# Patient Record
Sex: Male | Born: 1965
Health system: Southern US, Community
[De-identification: ages and names within clinical notes are randomized; demographics above are authoritative.]

## PROBLEM LIST (undated history)

## (undated) DIAGNOSIS — I10 Essential (primary) hypertension: Secondary | ICD-10-CM

## (undated) DIAGNOSIS — K219 Gastro-esophageal reflux disease without esophagitis: Secondary | ICD-10-CM

## (undated) DIAGNOSIS — R7303 Prediabetes: Secondary | ICD-10-CM

## (undated) DIAGNOSIS — E7800 Pure hypercholesterolemia, unspecified: Secondary | ICD-10-CM

## (undated) HISTORY — DX: Pure hypercholesterolemia, unspecified: E78.00

## (undated) HISTORY — DX: Prediabetes: R73.03

---

## 1998-04-04 ENCOUNTER — Emergency Department (HOSPITAL_COMMUNITY): Admission: EM | Admit: 1998-04-04 | Discharge: 1998-04-05 | Payer: Self-pay | Admitting: Emergency Medicine

## 2000-11-17 ENCOUNTER — Encounter: Payer: Self-pay | Admitting: Internal Medicine

## 2000-11-17 ENCOUNTER — Observation Stay (HOSPITAL_COMMUNITY): Admission: EM | Admit: 2000-11-17 | Discharge: 2000-11-18 | Payer: Self-pay | Admitting: Emergency Medicine

## 2003-03-02 ENCOUNTER — Emergency Department (HOSPITAL_COMMUNITY): Admission: AD | Admit: 2003-03-02 | Discharge: 2003-03-02 | Payer: Self-pay | Admitting: Family Medicine

## 2004-04-25 ENCOUNTER — Emergency Department (HOSPITAL_COMMUNITY): Admission: EM | Admit: 2004-04-25 | Discharge: 2004-04-25 | Payer: Self-pay | Admitting: Emergency Medicine

## 2005-10-01 ENCOUNTER — Emergency Department (HOSPITAL_COMMUNITY): Admission: EM | Admit: 2005-10-01 | Discharge: 2005-10-01 | Payer: Self-pay | Admitting: Emergency Medicine

## 2005-10-27 ENCOUNTER — Emergency Department (HOSPITAL_COMMUNITY): Admission: EM | Admit: 2005-10-27 | Discharge: 2005-10-27 | Payer: Self-pay | Admitting: Family Medicine

## 2006-05-31 IMAGING — CR DG TOE GREAT 2+V*R*
3 series · 3 of 3 positions shown · non-contrast
Comparison: none

CLINICAL DATA: Dropped desk on toe.
 RIGHT GREAT TOE:
 There is a fracture of the tuft of the distal first phalanx with slight displacement.  There is some degenerative spurring on the medial aspect of the distal first phalanx.

[view not recorded (1 of 3)]
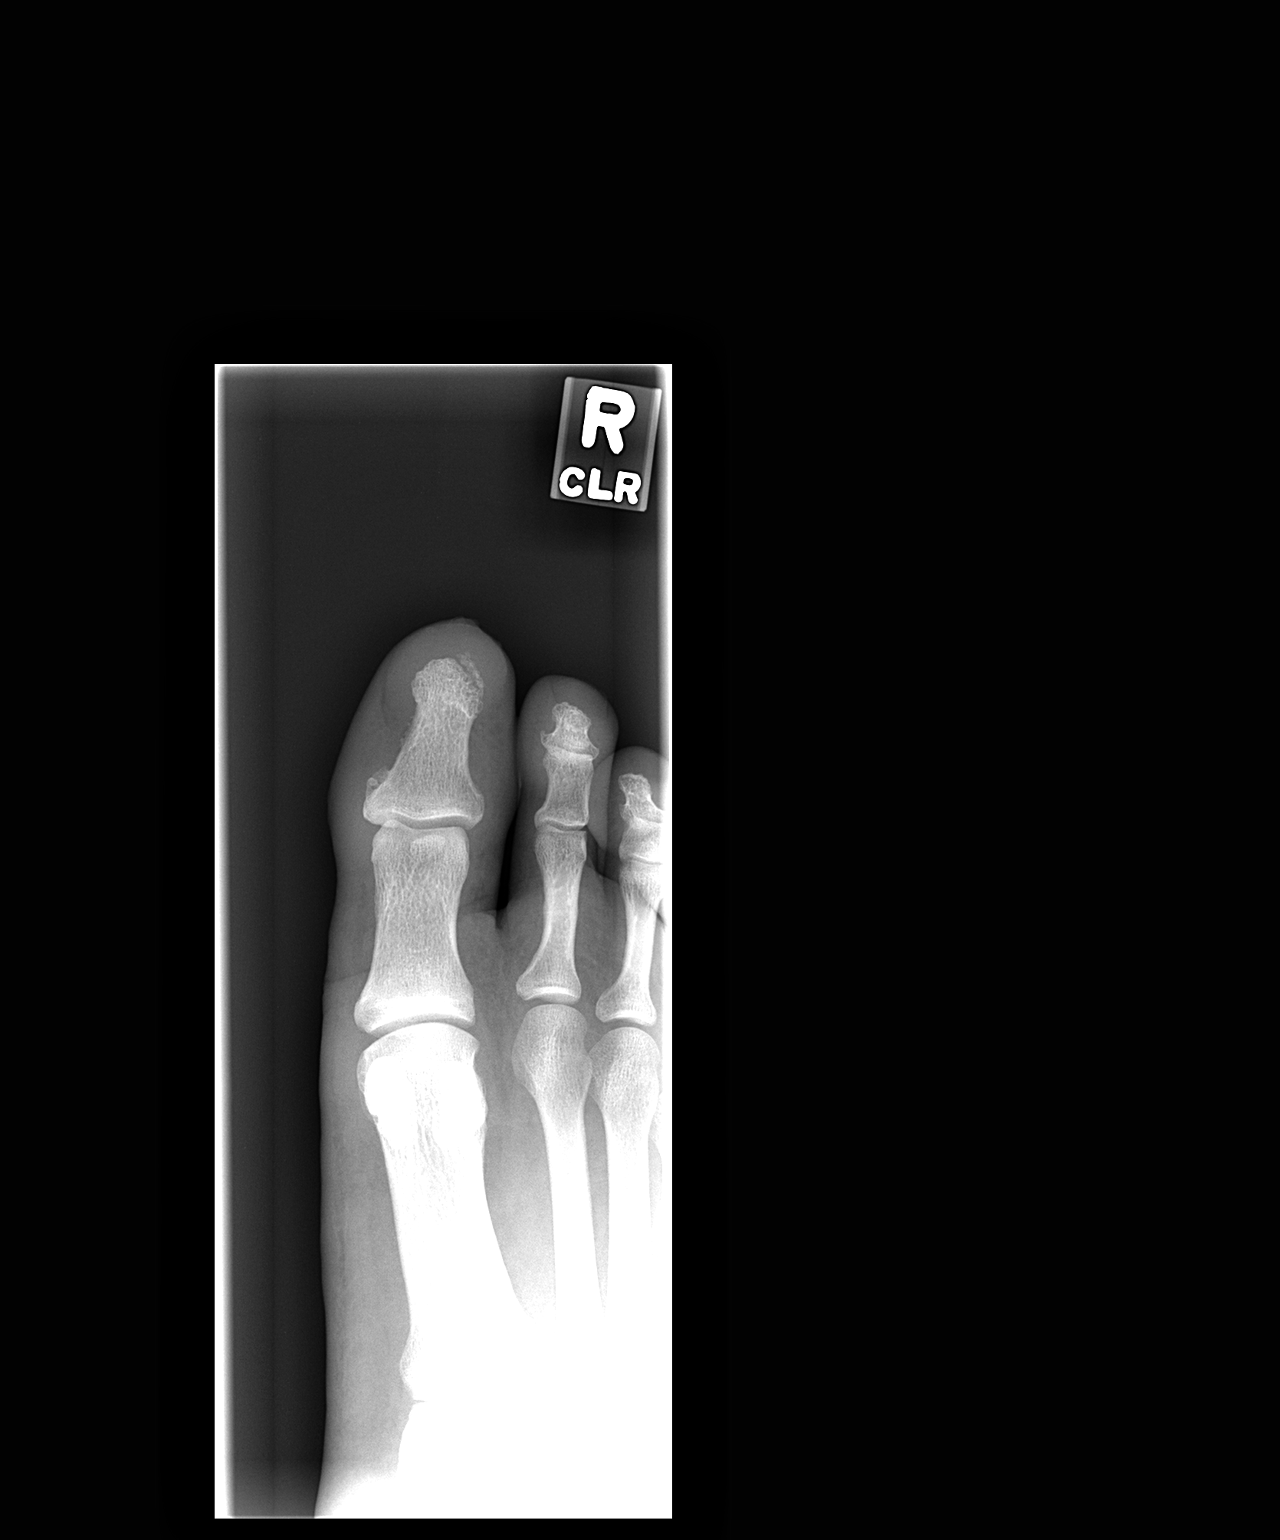

[view not recorded (2 of 3)]
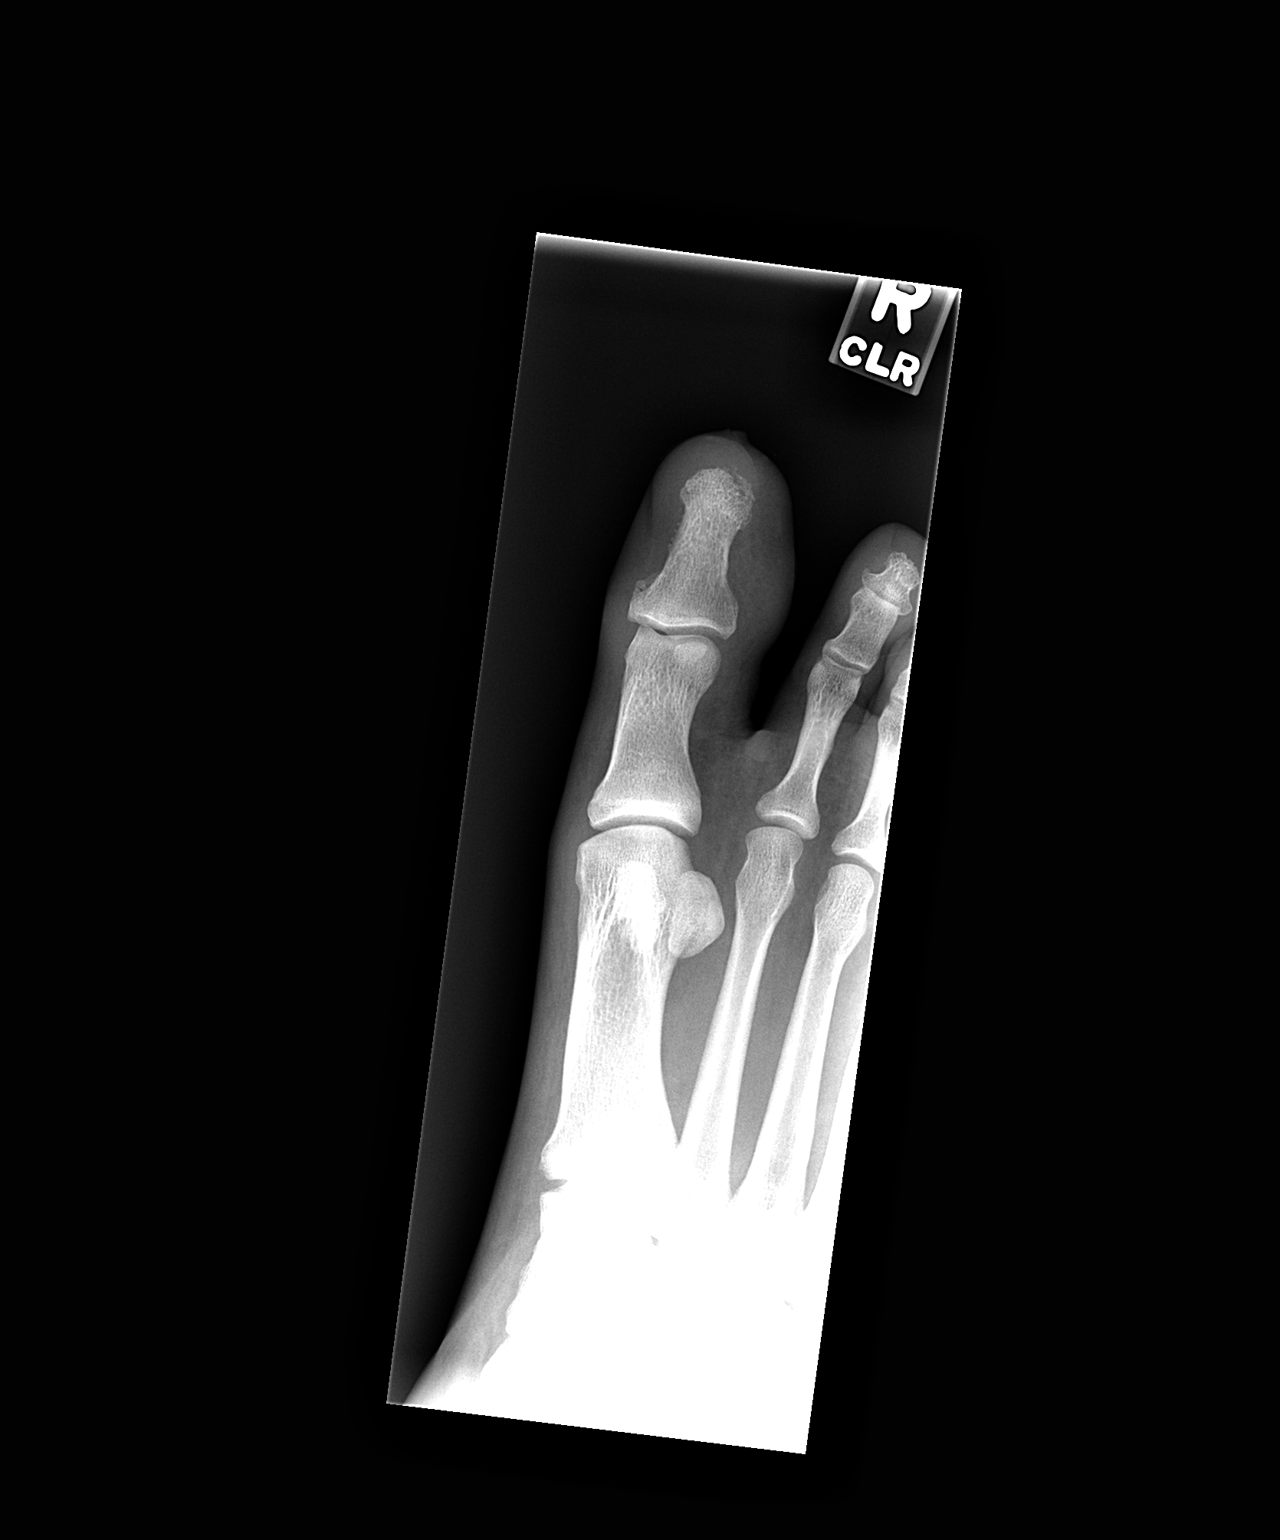

[view not recorded (3 of 3)]
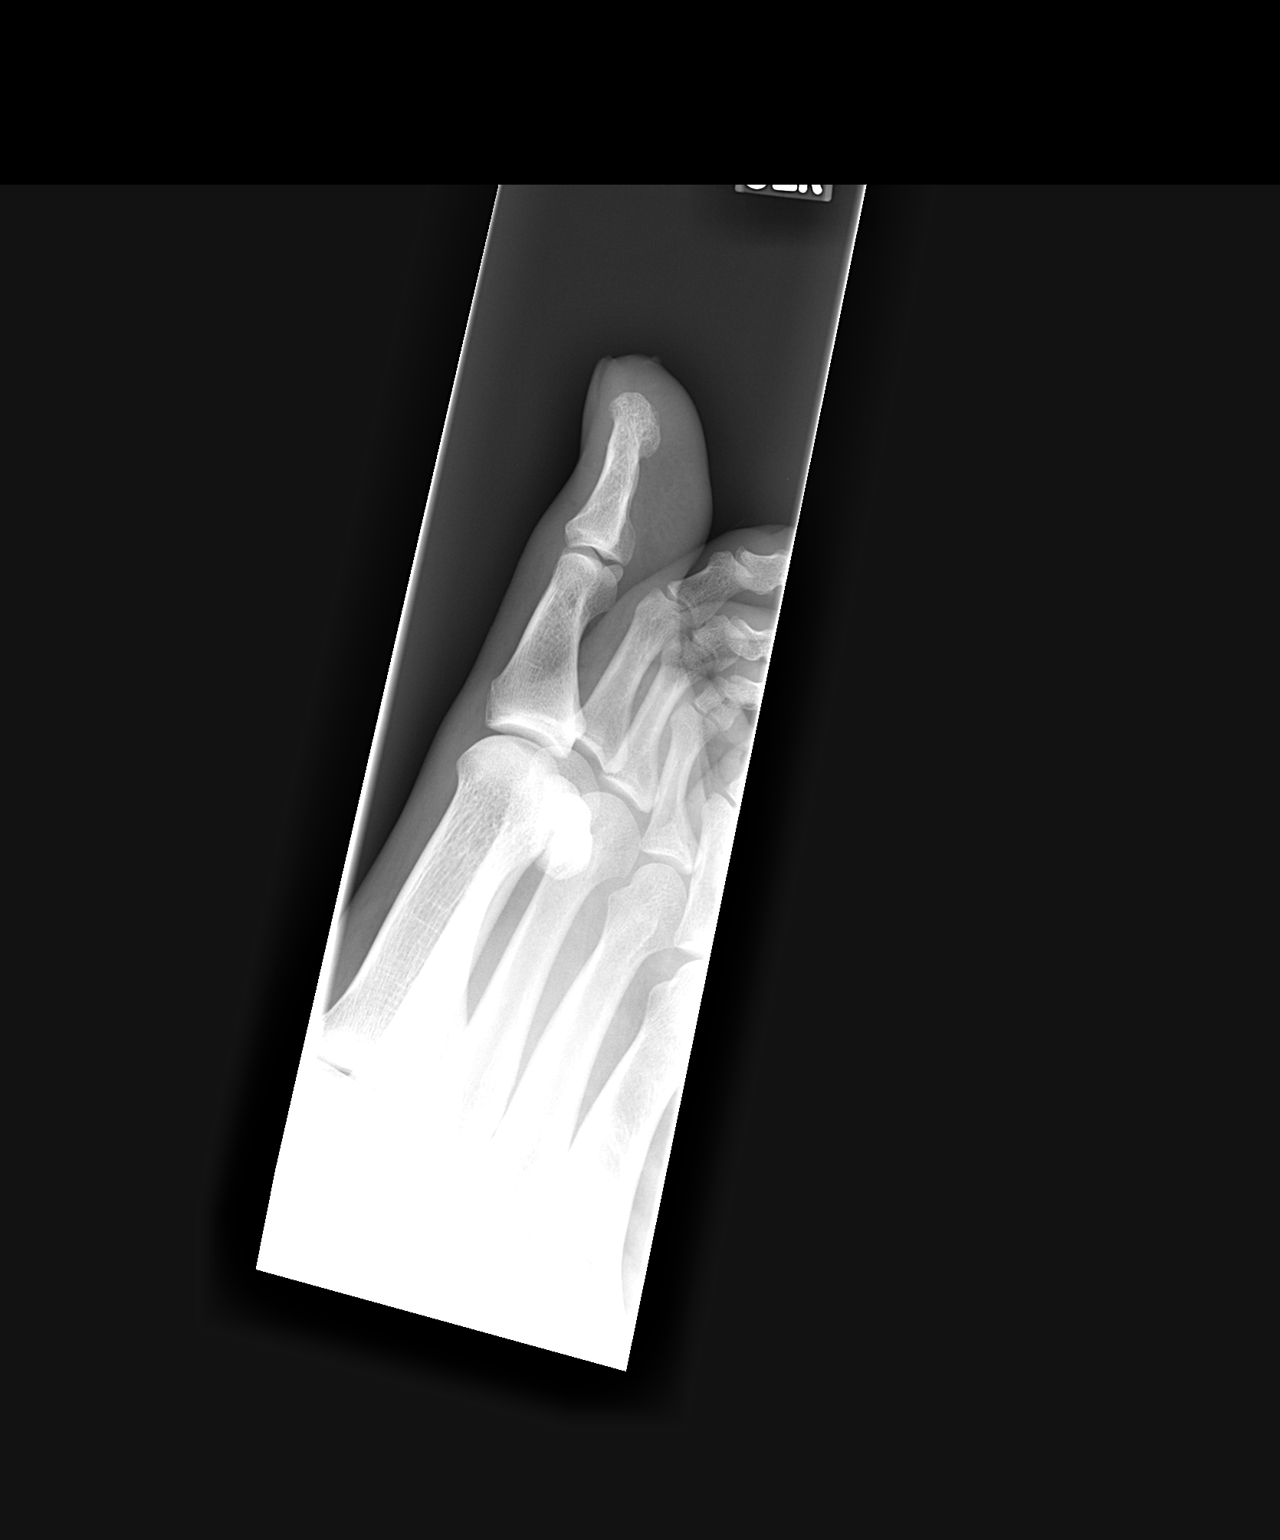

[3 of 3 positions shown; findings below may reference images not displayed]

IMPRESSION: Fracture of the tuft of the first distal phalanx.

## 2007-11-06 IMAGING — CR DG HAND COMPLETE 3+V*L*
3 series · 3 of 3 positions shown · non-contrast
Comparison: none

CLINICAL DATA: Laceration and pain.
 LEFT HAND ? 3 VIEW:

[x hand pa left]
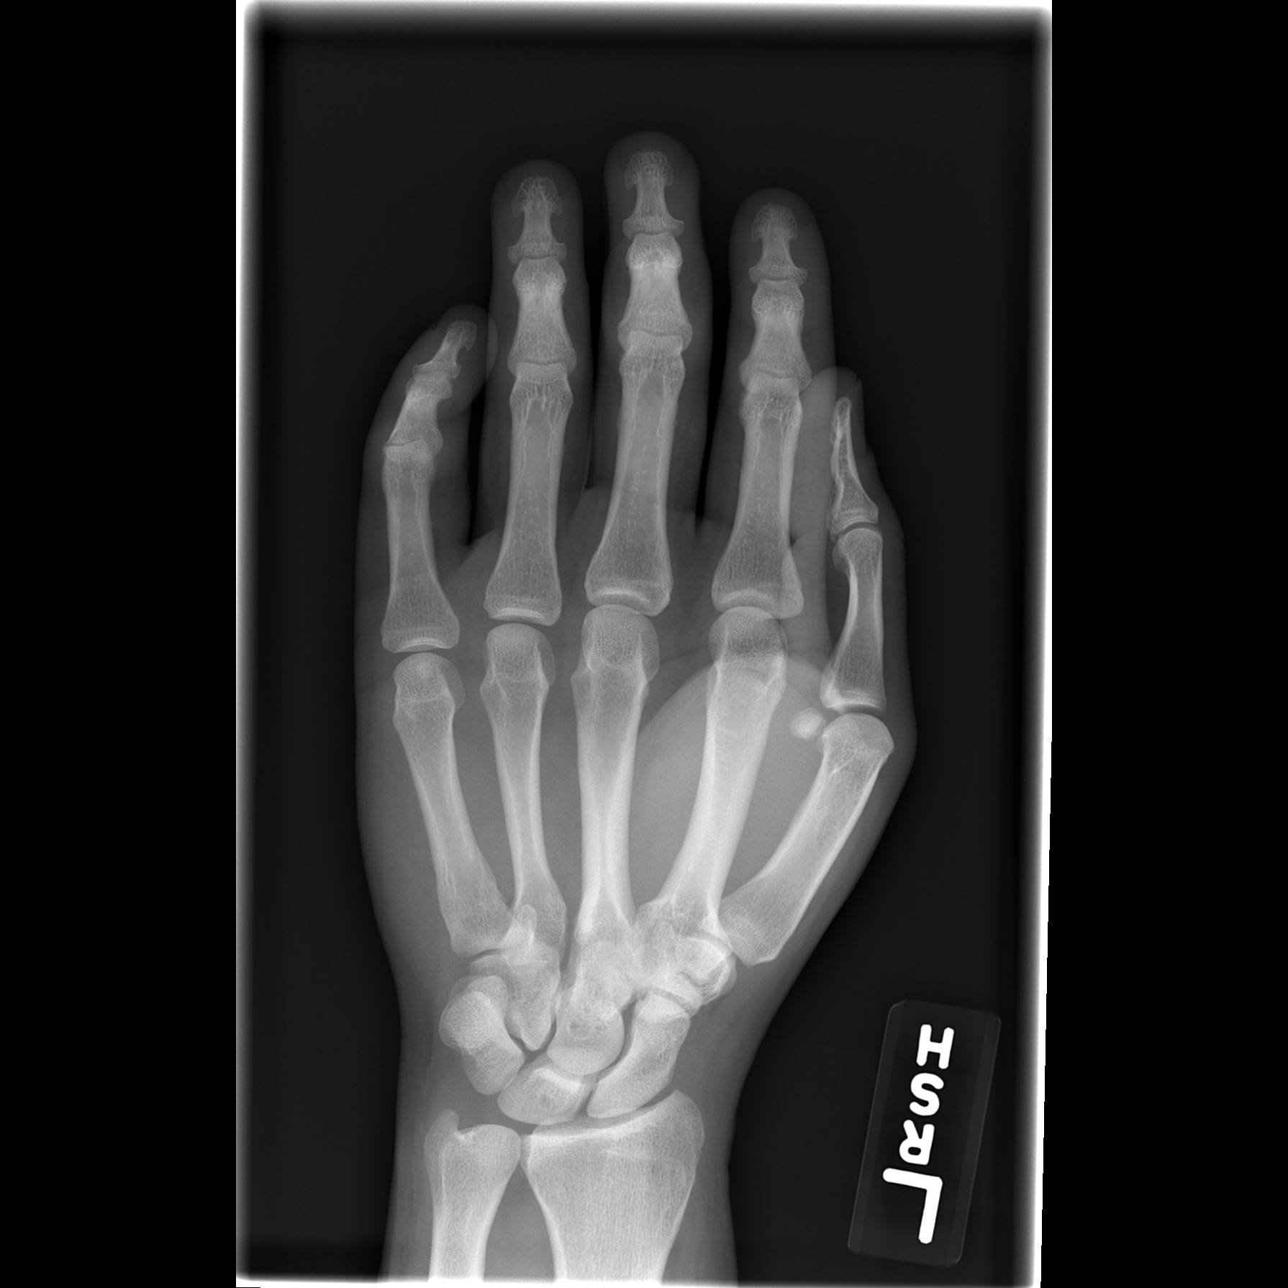

[x hand oblique left]
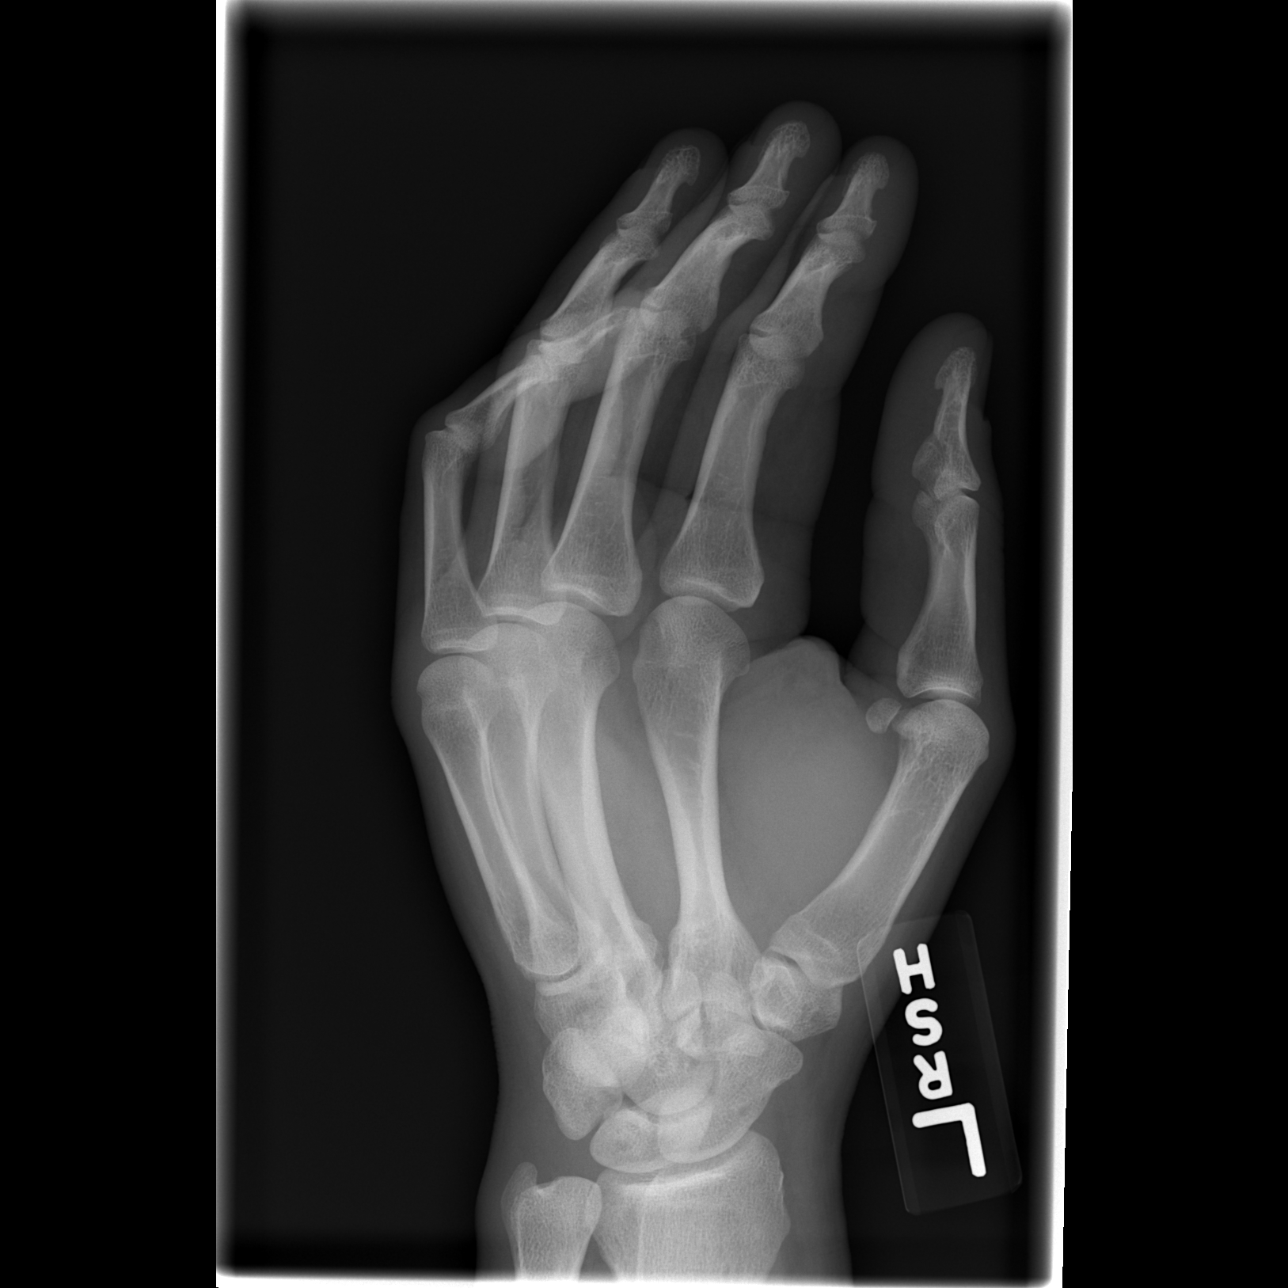

[x hand lat left]
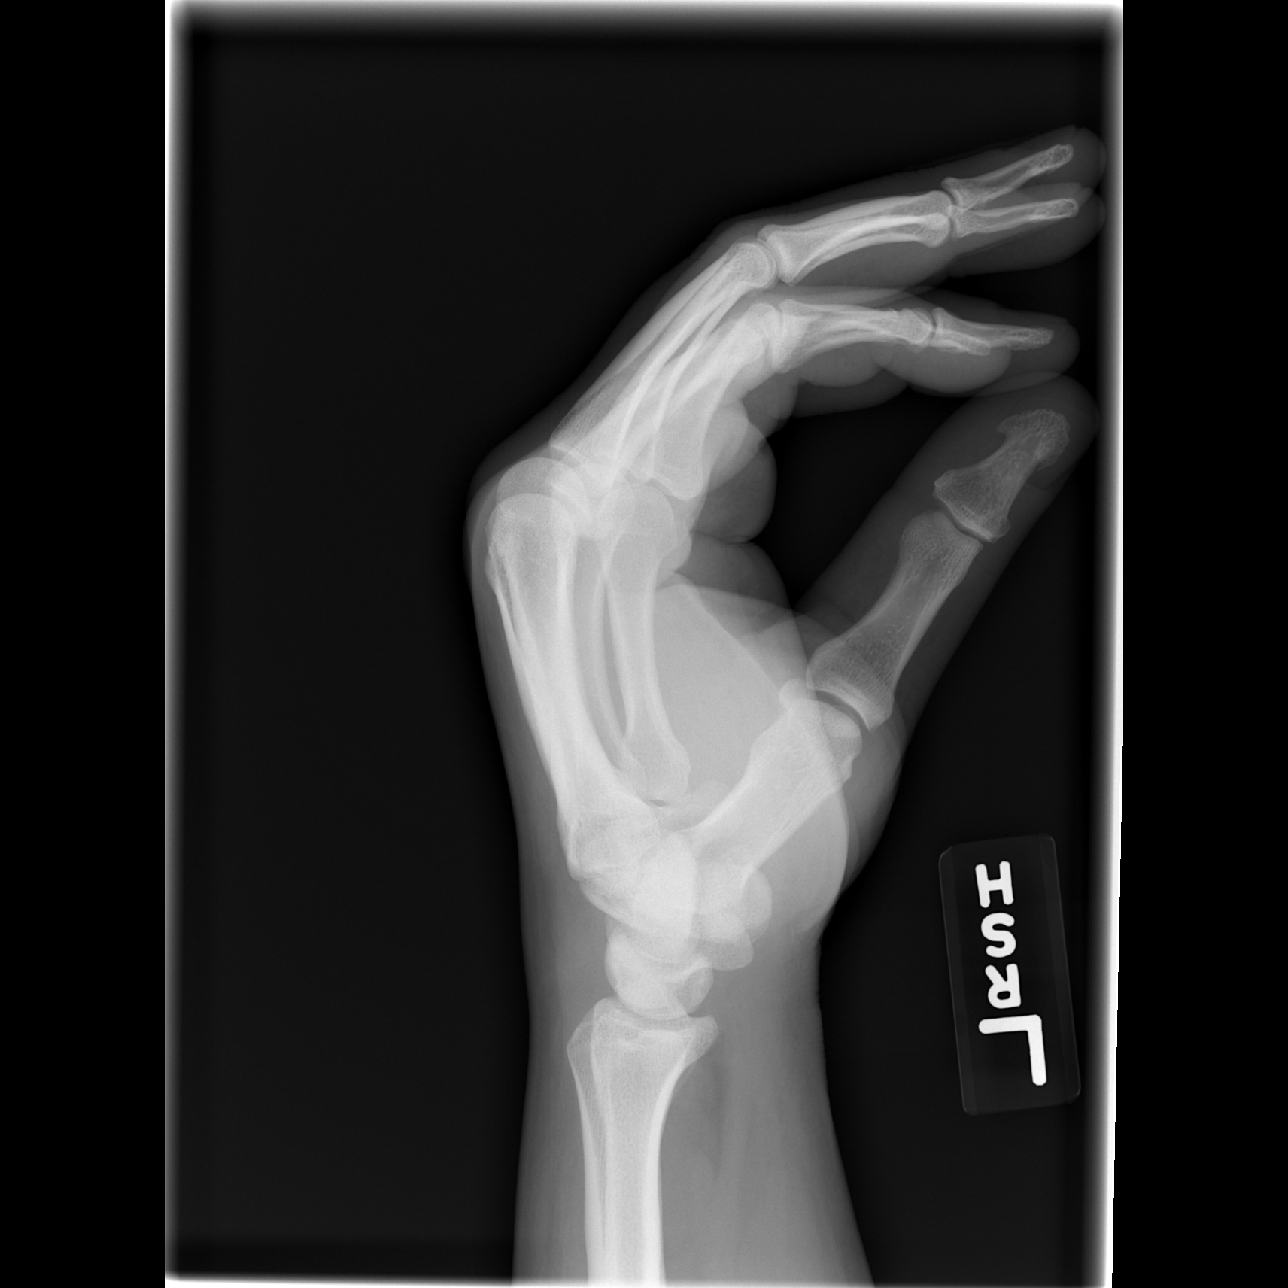

[3 of 3 positions shown; findings below may reference images not displayed]

FINDINGS: There are no fractures or dislocations.  There is no evidence for radiopaque foreign body.
IMPRESSION: No evidence for fracture or foreign body.

## 2008-01-09 ENCOUNTER — Emergency Department (HOSPITAL_COMMUNITY): Admission: EM | Admit: 2008-01-09 | Discharge: 2008-01-09 | Payer: Self-pay | Admitting: Family Medicine

## 2010-04-22 LAB — POCT I-STAT, CHEM 8
BUN: 15 mg/dL (ref 6–23)
Calcium, Ion: 1.18 mmol/L (ref 1.12–1.32)
Chloride: 103 mEq/L (ref 96–112)
Creatinine, Ser: 1.1 mg/dL (ref 0.4–1.5)
Glucose, Bld: 111 mg/dL — ABNORMAL HIGH (ref 70–99)
HCT: 54 % — ABNORMAL HIGH (ref 39.0–52.0)
Hemoglobin: 18.4 g/dL — ABNORMAL HIGH (ref 13.0–17.0)
Potassium: 3.8 mEq/L (ref 3.5–5.1)
Sodium: 140 mEq/L (ref 135–145)
TCO2: 27 mmol/L (ref 0–100)

## 2010-04-22 LAB — HEMOCCULT GUIAC POC 1CARD (OFFICE): Fecal Occult Bld: POSITIVE

## 2010-04-24 ENCOUNTER — Other Ambulatory Visit: Payer: Self-pay | Admitting: Family Medicine

## 2010-04-25 ENCOUNTER — Ambulatory Visit
Admission: RE | Admit: 2010-04-25 | Discharge: 2010-04-25 | Disposition: A | Discharge status: Home / Self Care | Payer: 59 | Source: Ambulatory Visit | Attending: Family Medicine | Admitting: Family Medicine

## 2010-04-25 MED ORDER — IOHEXOL 300 MG/ML  SOLN
100.0000 mL | Freq: Once | INTRAMUSCULAR | Status: AC | PRN
  Administered 2010-04-25: 100 mL via INTRAVENOUS

## 2010-05-24 NOTE — H&P (Signed)
[place]. [facility]  Patient:    Shaun Noble, Shaun Noble Visit Number: [id number] MRN: [id number]          Service Type: Attending:  Soyla Noble. Shaun Noble, M.D. Dictated by:   Shaun Noble, P.A. Adm. Date:  11/17/00                           History and Physical  DATE OF BIRTH:  October 09, 1965  CHIEF COMPLAINT:  Chest pressure.  HISTORY OF PRESENT ILLNESS:  Shaun Noble is a 45 year old African-American male who presents to the office today as a new patient.  He is complaining of substernal chest pressure which began this morning when he awoke.  It has been constant since that time, and has been waxing and waning.  It is not accompanied by any diaphoresis, but he has had some dizziness and nausea, and he feels slightly short of breath with it.  He also complains of some numbness in his left axillary area.  He does have a past medical history of reflux, and has been taking his Prevacid regularly.  Also he has a past history of some allergies.  He has been followed by [facility] and [facility] over the last several years, and has not had a regular physician.  PAST MEDICAL HISTORY 1. Significant for chronic hepatitis-C, initially diagnosed in May 2000,    by Urgent Care. 2. Allergic rhinitis. 3. He has a history of some eczema.  PAST SURGICAL HISTORY:  None.  CURRENT MEDICATIONS 1. Prevacid 30 mg q.d. 2. Allegra D q.d. 3. Sudafed p.r.n.  ALLERGIES:  No known drug allergies.  SOCIAL HISTORY:  The patient is single.  He does not use tobacco.  He drinks beer, sometimes six to seven at a time on the weekends.  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.  He has 12 years of education.  He has no children.  FAMILY HISTORY:  Mother age 64, with high blood pressure.  Father age 69, with  hypertension. He has one brother age 58, with hypertension as well.  Maternal grandfather died of throat cancer.  Maternal grandmother died of  tuberculosis. Paternal grandfather died of diabetes mellitus.  He also has two paternal aunts and uncles with diabetes mellitus.  Paternal grandmother died in childbirth.  REVIEW OF SYSTEMS:  He has recently experienced some increase in weight; he tells me that he feels that due to his restarting his beer intake.  He denies any fevers, chills, or night sweats.  He had some dizziness this morning, accompanied by some headaches.  He denies any dysphagia.  He does have a history of some reflux which does seem to be controlled with Prevacid.  He complains of recurrent sinus rhinitis and congestion.  Also he complains that his stool has been soft over the last several days.  He denies any hematochezia or melena.  He denies having any dysuria or frequent urination. He does complain of tenderness of his left testicle with some possible swelling.  He is also complaining of some numbness in his left armpit and his left foot.  PHYSICAL EXAMINATION  VITAL SIGNS:  Weight today is 202 pounds, blood pressure 118/70, pulse 72, respirations 16.  GENERAL:  The patient is a well-developed, well-nourished African-American male, in no acute distress.  HEENT:  He has some folliculitis noted on his scalp.  His tympanic membranes are clear bilaterally.  His oropharynx is moist and clear.  His dentition is in poor repair.  Pupils equal, round, reactive to light.  Equal ocular motion is intact.  NECK:  No thyromegaly or carotid bruits.  CARDIOVASCULAR:  He has a normal S1, S2, without murmur, rub, or gallop. Pulses in the 60s with the patient sitting.  He has no chest wall tenderness.  LUNGS:  Clear to auscultation bilaterally.  Chest shape is normal.  No rales, rhonchi, or wheezing.  BREASTS:  No gynecomastia or masses.  NODES:  No cervical, supraclavicular, axillary, or inguinal adenopathy.  ABDOMEN:  He has good bowel sounds throughout.  He is slightly tender over his liver with a possible  enlargement with the border being slightly lower than usual.  He has no tenderness over his epigastric area or his left lower quadrant.  GENITOURINARY:  He is a normal circumcised male with descended testicles.  No hernia are palpated today.  The penis is without discharge or lesions.  Right testicle is normal.  The left testicle is very tender to palpation, consistent with epididymitis.  EXTREMITIES:  Normal muscle mass. Joints appear normal.  There is no lower extremity edema.  NEUROLOGIC:  He has normal sensation to his skin and his left axillary, as well as in his left foot.  Cranial nerves II-XII are intact.  He has 5/5 strength in his upper and lower extremities.  Normal speech and gait with no tremors.  The exam is nonfocal.  An electrocardiogram is done today which showed some possible left ventricular hypertrophy.  He also has a possible early transition.  He has T-wave inversion in lead III with ST segment elevation in the anterior V2 and V3 leads, consistent with possible ischemia.  ASSESSMENT/PLAN: 1. Abnormal electrocardiogram in a patient with chest pain:  He is given 325    mg of aspirin in the office and placed on oxygen at 2 L via nasal cannula.    He is also given nitroglycerin sublingually which does relieve his chest    pain.  Dr. Soyla Noble. Shaun Noble has consulted and examined the patient with me    today.  We will admit him and obtain cardiac enzymes this afternoon.  Also    we will consult cardiology. 2. History of gastroesophageal reflux disease:  We will keep him on    Prevacid. 3. History of hepatitis-__:  We will obtain surface antigen on him today. 4. Possible epididymitis along with folliculitis:  He is placed on Levaquin    500 mg orally. Dictated by:   Shaun Noble, P.A. Attending:  Soyla Noble. Shaun Noble, M.D. DD:  11/17/00 TD:  11/17/00 Job: [id number]

## 2010-05-24 NOTE — Discharge Summary (Signed)
[place]. [facility]  Patient:    Shaun Noble, Shaun Noble Visit Number: [id number] MRN: [id number]          Service Type: MED Location: 2000 2024 01 Attending Physician:  Londell Moh Dictated by:   Soyla Murphy. Renne Crigler, M.D. Admit Date:  11/17/2000 Discharge Date: 11/18/2000   CC:         Dr. Domingo Sep of [facility]   Discharge Summary  NOTE:  24-hour observation summary.  DIAGNOSTIC STUDIES:  EKG:  Normal sinus rhythm, nonspecific ST-T abnormalities.  Chest x-ray:  No active disease.  LABORATORY STUDIES:  Hemoglobin 16.8, white count 8.9, platelets normal, PT/PTT normal.  CMET entirely normal.  Lipase is slightly low at 21 - this is not clinically significant.  CK, CK-MB, and troponins all normal and normal range.  Cholesterol was 189.  Triglycerides 113, HDL 50, LDL 116.  Hepatitis studies:  Surface antigen negative.  Core IgM negative.  Surface antibody positive.  Core antibody positive.  E-antigen negative.  HIV nonreactive. Drug screen negative.  Urinalysis normal.  Urine culture:  Now growth.  HOSPITAL COURSE:  Please see admission history and physical for details of his presentation.  Briefly, he had left axillary numbness one day followed by the next day by chest pressure in mid anterior chest without associated symptoms. EKG showed some minor abnormalities, probably consistent with early repolarization and prominent T-wave and some asymmetric T-wave inversion.  The repeat EKG was slightly different with normalization of the previous T-wave changes.  For this reason, he was admitted to rule out MI.  Indeed, he did. There were no clinical or other pieces of evidence to support pericarditis. He was without chest pain the following day and was discharged with further workup scheduled as an outpatient.  Also during hospital stay, he was noted to have probable epididymitis and was started on Levaquin for this.  DISPOSITION:  The  patient was discharged to home on aspirin one p.o. daily and Levaquin 500 mg p.o. daily for seven days.  FOLLOW-UP APPOINTMENT:  He will have a Cardiolite and a 2D echocardiogram as an outpatient and see me in about one week. Dictated by:   Soyla Murphy. Renne Crigler, M.D. Attending Physician:  Londell Moh DD:  11/29/00 TD:  11/30/00 Job: [id number]

## 2012-06-09 ENCOUNTER — Emergency Department (HOSPITAL_BASED_OUTPATIENT_CLINIC_OR_DEPARTMENT_OTHER): Payer: 59

## 2012-06-09 ENCOUNTER — Emergency Department (HOSPITAL_BASED_OUTPATIENT_CLINIC_OR_DEPARTMENT_OTHER)
Admission: EM | Admit: 2012-06-09 | Discharge: 2012-06-09 | Disposition: A | Discharge status: Home / Self Care | Payer: 59 | Attending: Emergency Medicine | Admitting: Emergency Medicine

## 2012-06-09 ENCOUNTER — Encounter (HOSPITAL_BASED_OUTPATIENT_CLINIC_OR_DEPARTMENT_OTHER): Payer: Self-pay

## 2012-06-09 DIAGNOSIS — R109 Unspecified abdominal pain: Secondary | ICD-10-CM

## 2012-06-09 DIAGNOSIS — I10 Essential (primary) hypertension: Secondary | ICD-10-CM | POA: Insufficient documentation

## 2012-06-09 DIAGNOSIS — Z8719 Personal history of other diseases of the digestive system: Secondary | ICD-10-CM | POA: Insufficient documentation

## 2012-06-09 DIAGNOSIS — R12 Heartburn: Secondary | ICD-10-CM | POA: Insufficient documentation

## 2012-06-09 HISTORY — DX: Essential (primary) hypertension: I10

## 2012-06-09 HISTORY — DX: Gastro-esophageal reflux disease without esophagitis: K21.9

## 2012-06-09 LAB — CBC WITH DIFFERENTIAL/PLATELET
Basophils Absolute: 0 10*3/uL (ref 0.0–0.1)
Basophils Relative: 0 % (ref 0–1)
HCT: 46.9 % (ref 39.0–52.0)
Lymphocytes Relative: 21 % (ref 12–46)
Monocytes Absolute: 0.9 10*3/uL (ref 0.1–1.0)
Neutro Abs: 5.6 10*3/uL (ref 1.7–7.7)
Neutrophils Relative %: 66 % (ref 43–77)
RDW: 13.9 % (ref 11.5–15.5)
WBC: 8.4 10*3/uL (ref 4.0–10.5)

## 2012-06-09 LAB — COMPREHENSIVE METABOLIC PANEL
ALT: 26 U/L (ref 0–53)
AST: 21 U/L (ref 0–37)
Albumin: 3.8 g/dL (ref 3.5–5.2)
Alkaline Phosphatase: 68 U/L (ref 39–117)
CO2: 23 mEq/L (ref 19–32)
Chloride: 104 mEq/L (ref 96–112)
Creatinine, Ser: 1.1 mg/dL (ref 0.50–1.35)
GFR calc non Af Amer: 80 mL/min — ABNORMAL LOW (ref >90)
Potassium: 4 mEq/L (ref 3.5–5.1)
Sodium: 137 mEq/L (ref 135–145)
Total Bilirubin: 0.3 mg/dL (ref 0.3–1.2)

## 2012-06-09 LAB — URINALYSIS, ROUTINE W REFLEX MICROSCOPIC
Bilirubin Urine: NEGATIVE
Glucose, UA: NEGATIVE mg/dL
Hgb urine dipstick: NEGATIVE
Ketones, ur: NEGATIVE mg/dL
Leukocytes, UA: NEGATIVE
Protein, ur: NEGATIVE mg/dL
pH: 7 (ref 5.0–8.0)

## 2012-06-09 MED ORDER — OMEPRAZOLE 20 MG PO CPDR: 20.0000 mg | DELAYED_RELEASE_CAPSULE | Freq: Every day | ORAL | Status: DC

## 2012-06-09 NOTE — ED Provider Notes (Signed)
History     CSN: [id number]  Arrival date & time 06/09/12  1124   First MD Initiated Contact with Patient 06/09/12 1134      Chief Complaint  Patient presents with  . Abdominal Pain    HPI  Patient presents with concerns of right upper quadrant pain.  Pain has been present for the past few days.  It seems to occur following by mouth intake.  It is sore, knot-like, nonradiating.  There is no associated dyspnea, other chest pain, other abdominal pain, vomiting or diarrhea. No dysuria. No fevers, no chills. Pain not appreciably changed with OTC medication. Patient has no history of known hepatobiliary dysfunction. He does state the past 2 weeks he has had increasing heartburn, which has been previously diagnosed, and for which he previously took PPI.  No current medication use. The patient states that over the past weeks he has had more consistent alcohol intake, though this has diminished in the past 3 or 4 days  Past Medical History  Diagnosis Date  . Hypertension   . GERD (gastroesophageal reflux disease)     History reviewed. No pertinent past surgical history.  No family history on file.  History  Substance Use Topics  . Smoking status: Never Smoker   . Smokeless tobacco: Not on file  . Alcohol Use: Yes      Review of Systems  Constitutional:       Per HPI, otherwise negative  HENT:       Per HPI, otherwise negative  Respiratory:       Per HPI, otherwise negative  Cardiovascular:       Per HPI, otherwise negative  Gastrointestinal: Positive for abdominal pain. Negative for nausea, vomiting and diarrhea.  Endocrine:       Negative aside from HPI  Genitourinary:       Neg aside from HPI   Musculoskeletal:       Per HPI, otherwise negative  Skin: Negative.   Neurological: Negative for syncope.    Allergies  Review of patient's allergies indicates no known allergies.  Home Medications  No current outpatient prescriptions on file.  BP 153/100  Pulse 70   Temp(Src) 98.3 F (36.8 C) (Oral)  Resp 20  Ht 6\' 1"  (1.854 m)  Wt 240 lb (108.863 kg)  BMI 31.67 kg/m2  SpO2 98%  Physical Exam  Nursing note and vitals reviewed. Constitutional: He is oriented to person, place, and time. He appears well-developed. No distress.  HENT:  Head: Normocephalic and atraumatic.  Eyes: Conjunctivae and EOM are normal.  Cardiovascular: Normal rate and regular rhythm.   Pulmonary/Chest: Effort normal. No stridor. No respiratory distress.  No chest wall tenderness  Abdominal: He exhibits no distension.  No appreciable discomfort about the abdomen, though there is mild pain in the right upper quadrant.  Bowel sounds are present, abdomen is soft, non-peritoneal  Musculoskeletal: He exhibits no edema.  Neurological: He is alert and oriented to person, place, and time.  Skin: Skin is warm and dry.  Psychiatric: He has a normal mood and affect.    ED Course  Procedures (including critical care time)  Labs Reviewed  CBC WITH DIFFERENTIAL  COMPREHENSIVE METABOLIC PANEL  LIPASE, BLOOD  URINALYSIS, ROUTINE W REFLEX MICROSCOPIC   No results found.   No diagnosis found.  2:28 PM On repeat exam the patient is in no distress.  We discussed ultrasound and lab results.  MDM  This patient presents with intermittent right upper quadrant pain, new  recurrence of heartburn.  On exam he is awake and alert, in no distress, he would be stable.  The patient's description of symptoms are worse following by mouth intake suggests a GI or hepatobiliary pathology.  The patient's labs are largely reassuring, as is his ultrasound.  Absent distress, significantly abnormal vital signs, he was discharged with initiation of PPI therapy, GI and primary care followup.        Gerhard Munch, MD 06/09/12 1429

## 2012-06-09 NOTE — ED Notes (Signed)
Patient transported to Ultrasound 

## 2012-06-09 NOTE — ED Notes (Signed)
C/o "knot" to RUQ and pain to RUQ and right "upper back"- x 5 days-denies v/d

## 2016-06-23 DIAGNOSIS — I10 Essential (primary) hypertension: Secondary | ICD-10-CM | POA: Diagnosis not present

## 2016-06-23 DIAGNOSIS — M545 Low back pain: Secondary | ICD-10-CM | POA: Diagnosis not present

## 2016-06-25 ENCOUNTER — Other Ambulatory Visit: Payer: Self-pay | Admitting: Family Medicine

## 2016-06-25 DIAGNOSIS — R102 Pelvic and perineal pain: Secondary | ICD-10-CM

## 2016-06-26 ENCOUNTER — Other Ambulatory Visit: Payer: Self-pay | Admitting: Family Medicine

## 2016-06-26 DIAGNOSIS — R102 Pelvic and perineal pain unspecified side: Secondary | ICD-10-CM

## 2016-07-03 ENCOUNTER — Other Ambulatory Visit: Payer: Self-pay

## 2016-07-03 ENCOUNTER — Ambulatory Visit
Admission: RE | Admit: 2016-07-03 | Discharge: 2016-07-03 | Disposition: A | Discharge status: Home / Self Care | Payer: 59 | Source: Ambulatory Visit | Attending: Family Medicine | Admitting: Family Medicine

## 2016-07-03 DIAGNOSIS — R102 Pelvic and perineal pain: Secondary | ICD-10-CM

## 2018-05-11 DIAGNOSIS — R102 Pelvic and perineal pain: Secondary | ICD-10-CM | POA: Diagnosis not present

## 2018-05-11 DIAGNOSIS — R140 Abdominal distension (gaseous): Secondary | ICD-10-CM | POA: Diagnosis not present

## 2018-05-11 DIAGNOSIS — E669 Obesity, unspecified: Secondary | ICD-10-CM | POA: Diagnosis not present

## 2018-05-11 DIAGNOSIS — R1030 Lower abdominal pain, unspecified: Secondary | ICD-10-CM | POA: Diagnosis not present

## 2018-07-12 ENCOUNTER — Encounter: Payer: Self-pay | Admitting: Urgent Care

## 2018-12-29 IMAGING — US US RENAL
1 series · 14 of 25 positions shown · non-contrast
Comparison: Scrotal ultrasound today reported separately. Cc
[facility] [HOSPITAL] Abdomen ultrasound 06/09/2012.

CLINICAL DATA: 50 y/o male with perineum pain, pain with urinating
for 1 month.

EXAM:
RENAL / URINARY TRACT ULTRASOUND COMPLETE

[Series 1: us renal · 0.26mm/px · 14 of 27 slices shown]
[im 1/27]
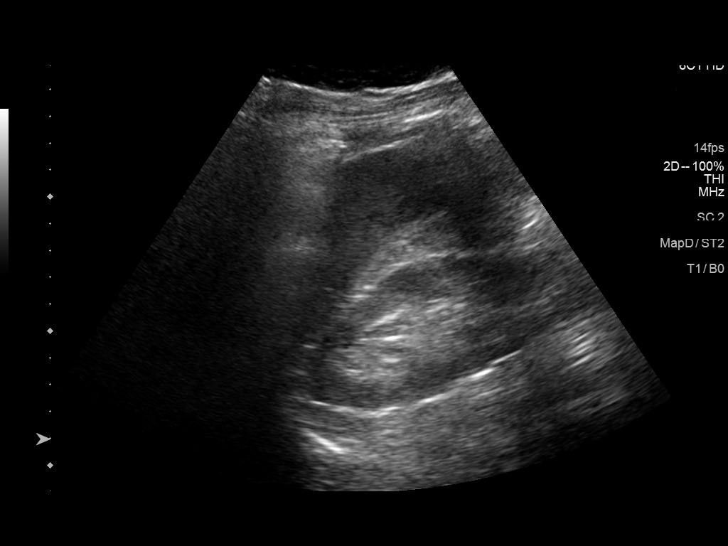
[im 3/27]
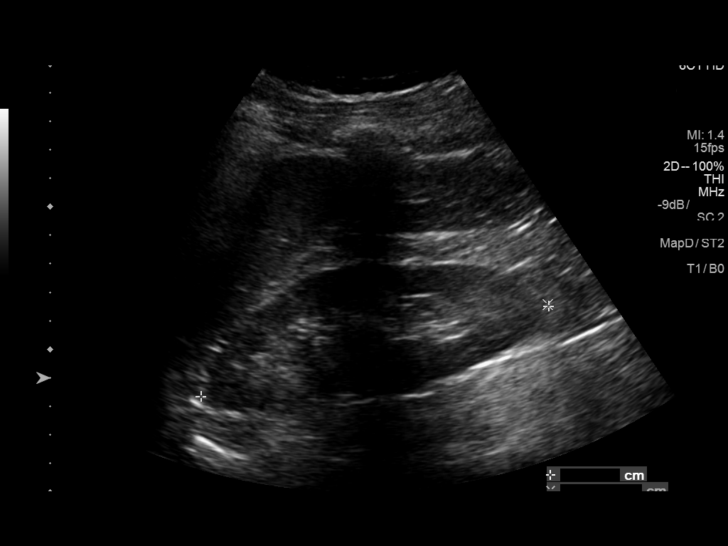
[im 5/27]
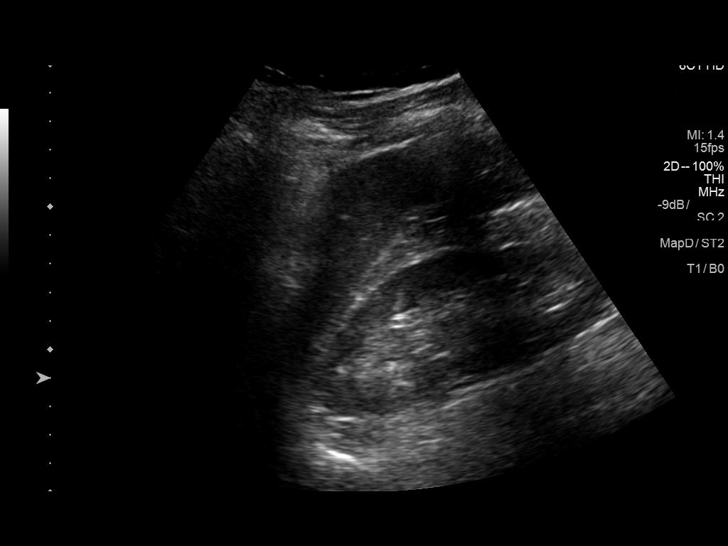
[im 7/27]
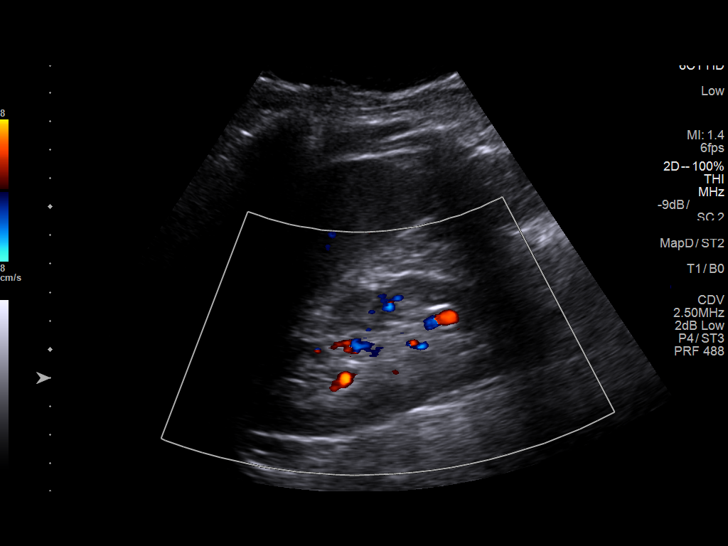
[im 9/27]
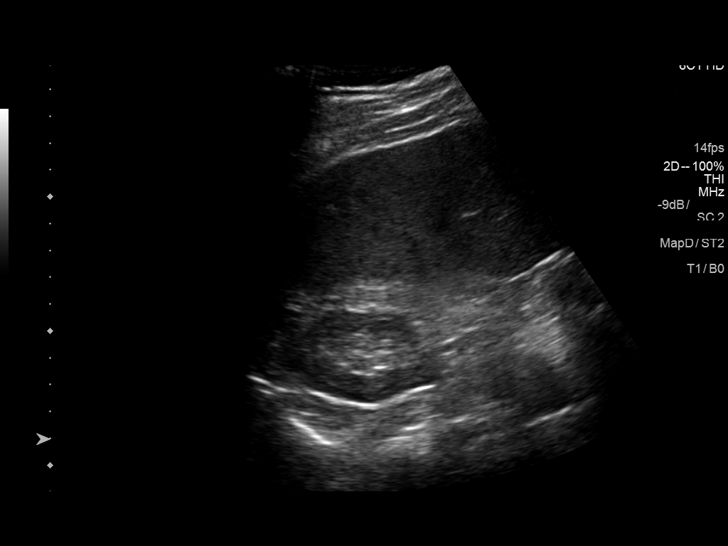
[im 10/27]
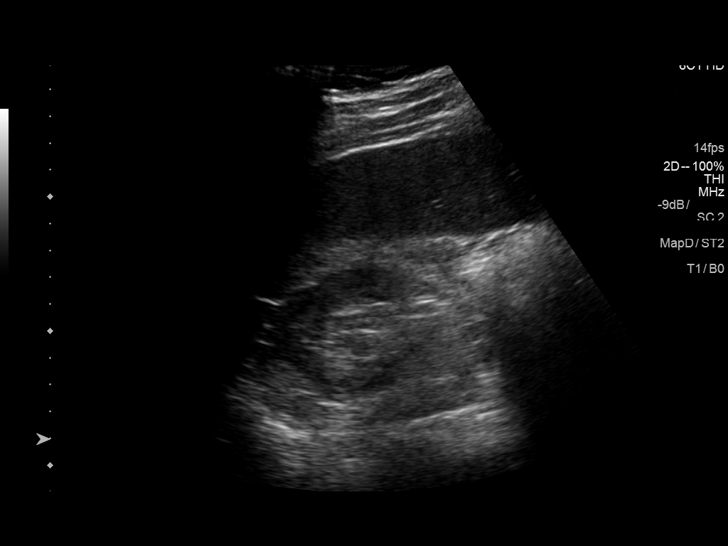
[im 12/27]
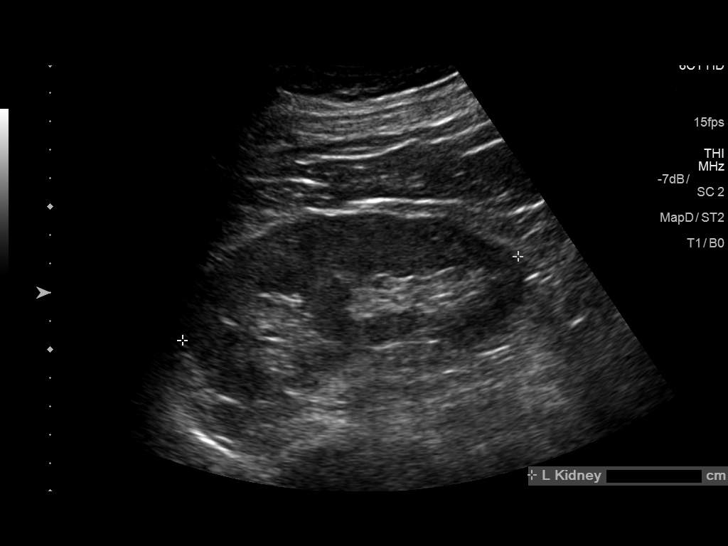
[im 15/27]
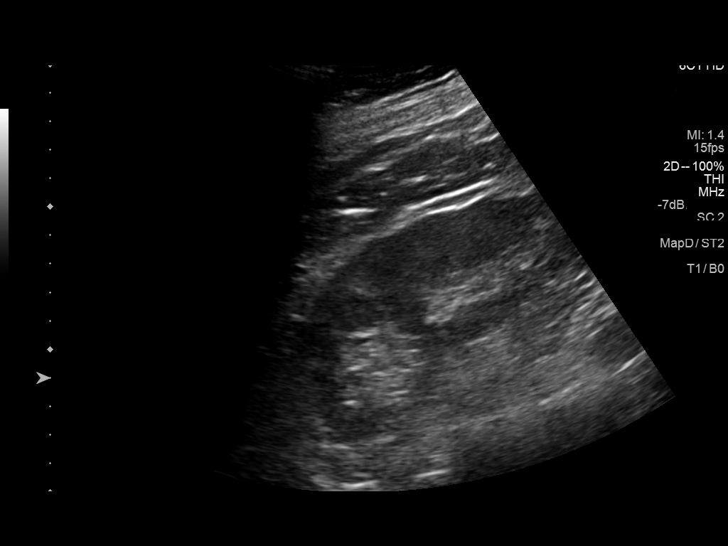
[im 17/27]
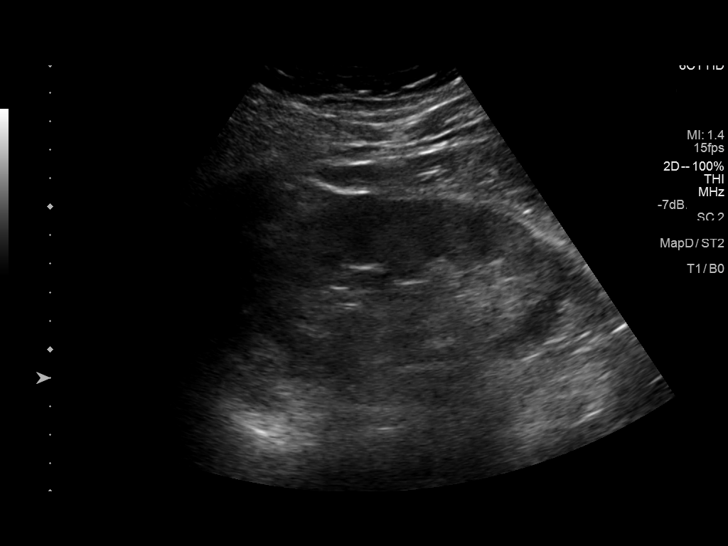
[im 18/27]
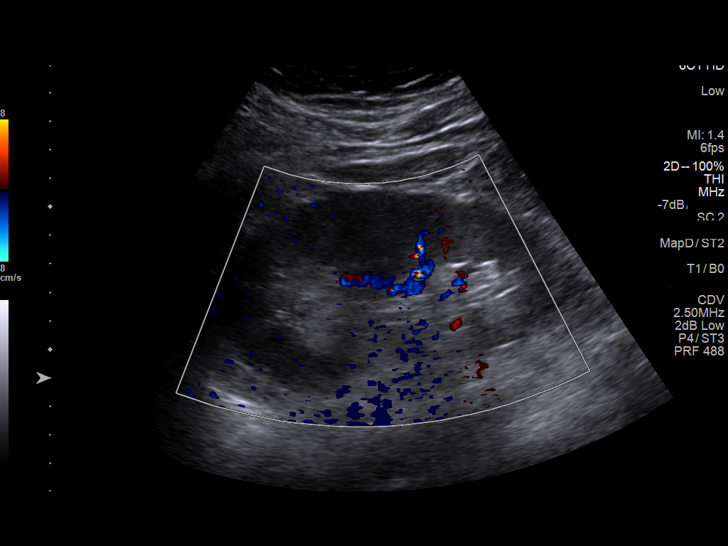
[im 20/27]
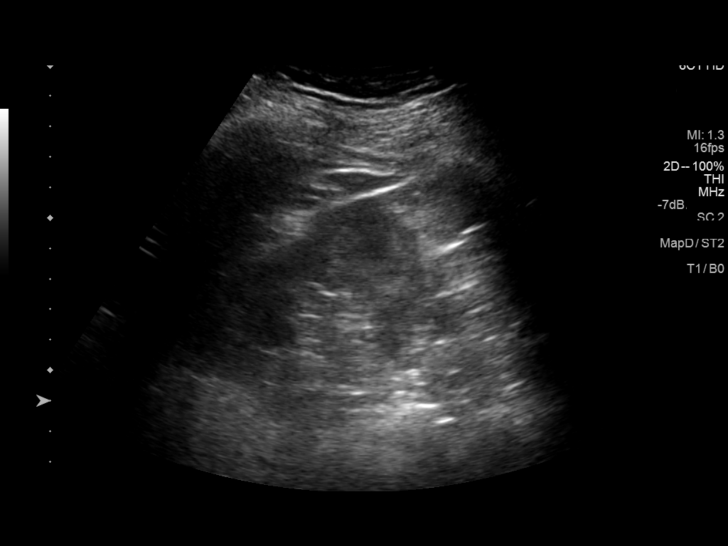
[im 22/27]
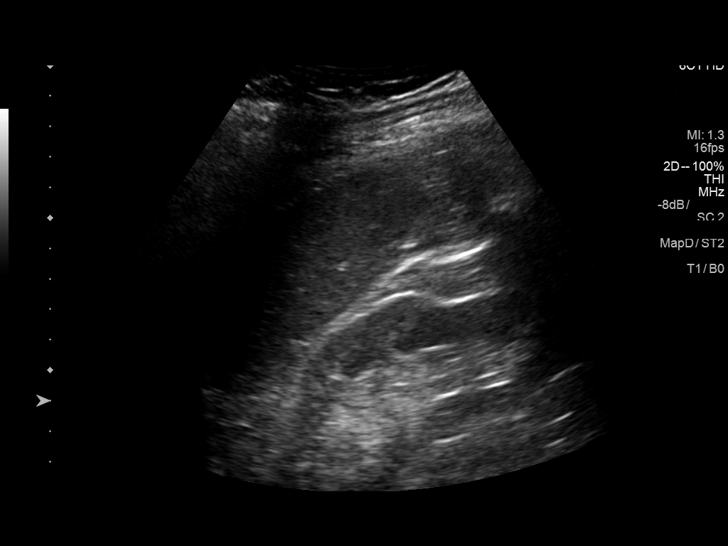
[im 24/27]
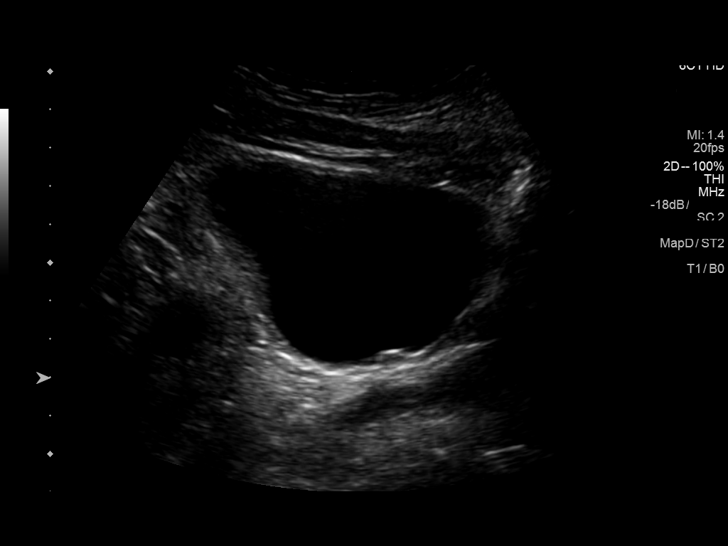
[im 27/27]
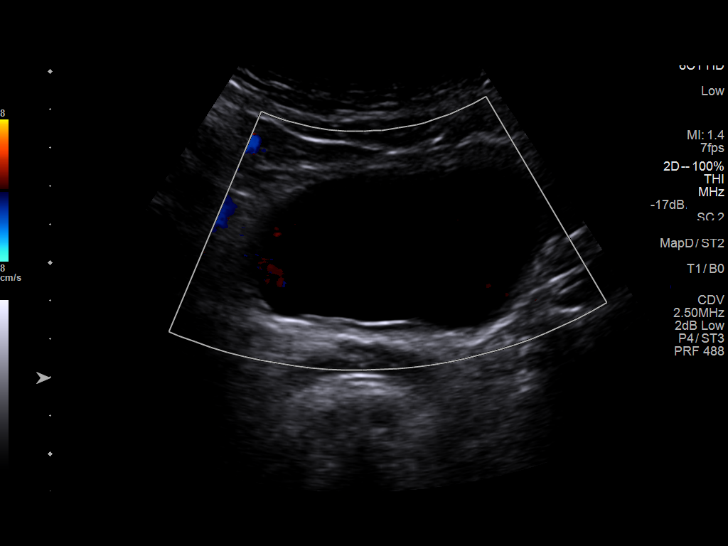

[14 of 25 positions shown; findings below may reference images not displayed]

FINDINGS: Right Kidney:

Length: 12.6 cm. Echogenicity within normal limits. No mass or
hydronephrosis visualized.

Left Kidney:

Length: 12.4 cm. Echogenicity within normal limits. No mass or
hydronephrosis visualized.

Bladder:

Appears normal for degree of bladder distention.
IMPRESSION: Normal ultrasound appearance of the kidneys and urinary bladder.

## 2019-02-04 ENCOUNTER — Ambulatory Visit: Payer: 59 | Attending: Internal Medicine

## 2022-01-01 ENCOUNTER — Other Ambulatory Visit: Payer: Self-pay

## 2022-01-01 ENCOUNTER — Emergency Department (HOSPITAL_BASED_OUTPATIENT_CLINIC_OR_DEPARTMENT_OTHER): Payer: 59

## 2022-01-01 ENCOUNTER — Encounter (HOSPITAL_BASED_OUTPATIENT_CLINIC_OR_DEPARTMENT_OTHER): Payer: Self-pay | Admitting: Emergency Medicine

## 2022-01-01 ENCOUNTER — Emergency Department (HOSPITAL_BASED_OUTPATIENT_CLINIC_OR_DEPARTMENT_OTHER)
Admission: EM | Admit: 2022-01-01 | Discharge: 2022-01-01 | Disposition: A | Discharge status: Home / Self Care | Payer: 59 | Attending: Emergency Medicine | Admitting: Emergency Medicine

## 2022-01-01 DIAGNOSIS — R079 Chest pain, unspecified: Secondary | ICD-10-CM

## 2022-01-01 DIAGNOSIS — Z79899 Other long term (current) drug therapy: Secondary | ICD-10-CM | POA: Insufficient documentation

## 2022-01-01 DIAGNOSIS — I10 Essential (primary) hypertension: Secondary | ICD-10-CM | POA: Diagnosis not present

## 2022-01-01 DIAGNOSIS — K219 Gastro-esophageal reflux disease without esophagitis: Secondary | ICD-10-CM | POA: Insufficient documentation

## 2022-01-01 LAB — TROPONIN I (HIGH SENSITIVITY): Troponin I (High Sensitivity): 4 ng/L (ref <18)

## 2022-01-01 LAB — CBC
HCT: 49.9 % (ref 39.0–52.0)
Hemoglobin: 16.7 g/dL (ref 13.0–17.0)
MCH: 28 pg (ref 26.0–34.0)
MCHC: 33.5 g/dL (ref 30.0–36.0)
MCV: 83.6 fL (ref 80.0–100.0)
Platelets: 260 10*3/uL (ref 150–400)
RBC: 5.97 MIL/uL — ABNORMAL HIGH (ref 4.22–5.81)
RDW: 13.2 % (ref 11.5–15.5)
WBC: 9.5 10*3/uL (ref 4.0–10.5)
nRBC: 0 % (ref 0.0–0.2)

## 2022-01-01 LAB — BASIC METABOLIC PANEL
Anion gap: 10 (ref 5–15)
BUN: 17 mg/dL (ref 6–20)
CO2: 23 mmol/L (ref 22–32)
Calcium: 9.3 mg/dL (ref 8.9–10.3)
Chloride: 102 mmol/L (ref 98–111)
Creatinine, Ser: 1.09 mg/dL (ref 0.61–1.24)
GFR, Estimated: >60 mL/min (ref >60)
Glucose, Bld: 109 mg/dL — ABNORMAL HIGH (ref 70–99)
Potassium: 4.2 mmol/L (ref 3.5–5.1)
Sodium: 135 mmol/L (ref 135–145)

## 2022-01-01 MED ORDER — HYDROCHLOROTHIAZIDE 25 MG PO TABS: 25.0000 mg | ORAL_TABLET | Freq: Every day | ORAL | 1 refills | Status: AC

## 2022-01-01 MED ORDER — OMEPRAZOLE 20 MG PO CPDR: 20.0000 mg | DELAYED_RELEASE_CAPSULE | Freq: Every day | ORAL | 1 refills | Status: AC

## 2022-01-01 NOTE — ED Triage Notes (Signed)
Headache x 3 days chest pain started yesterday on and off. Went to UC and was sent to ER for further evaluation. Pt states pain is mid sternal and feels like indigestion.

## 2022-01-01 NOTE — ED Provider Notes (Signed)
[facility] EMERGENCY DEPARTMENT Provider Note   CSN: [id number] Arrival date & time: 01/01/22  1427     History  Chief Complaint  Patient presents with   Chest Pain   Headache    Shaun Noble is a 56 y.o. male past medical history significant for acid reflux, hypertension who is not currently taking medication regularly for either medication or hypertension who presents with concern for intermittent chest pain, headache for 3 days.  Patient reports no chest pain, headache at time my evaluation, reports that he had headache earlier during his emergency department stay but it is since resolved.  He reports last chest pain episode was earlier this morning, greater than 6 hours prior to arrival.  Patient reports that chest pain is nonexertional in nature, not radiating to arms, neck, not associated with nausea, diaphoresis.  Patient does report that he has been under a lot of stress recently, and drinking slightly heavier than normal, reports about 3 drinks per day.  Patient reports that chest pain does feel similar to previous acid reflux, worse with certain foods, alcohol, and laying flat.   Chest Pain Associated symptoms: headache   Headache      Home Medications Prior to Admission medications   Medication Sig Start Date End Date Taking? Authorizing Provider  hydrochlorothiazide (HYDRODIURIL) 25 MG tablet Take 1 tablet (25 mg total) by mouth daily. 01/01/22  Yes Denelle Capurro H, PA-C  omeprazole (PRILOSEC) 20 MG capsule Take 1 capsule (20 mg total) by mouth daily. 01/01/22  Yes Sheria Rosello H, PA-C      Allergies    Patient has no known allergies.    Review of Systems   Review of Systems  Cardiovascular:  Positive for chest pain.  Neurological:  Positive for headaches.  All other systems reviewed and are negative.   Physical Exam Updated Vital Signs BP (!) 174/107   Pulse 77   Temp 97.9 F (36.6 C) (Oral)   Resp 17   Ht 6\' 1"  (1.854 m)    Wt 121.1 kg   SpO2 97%   BMI 35.23 kg/m  Physical Exam Vitals and nursing note reviewed.  Constitutional:      General: He is not in acute distress.    Appearance: Normal appearance.  HENT:     Head: Normocephalic and atraumatic.  Eyes:     General:        Right eye: No discharge.        Left eye: No discharge.  Cardiovascular:     Rate and Rhythm: Normal rate and regular rhythm.     Heart sounds: No murmur heard.    No friction rub. No gallop.  Pulmonary:     Effort: Pulmonary effort is normal.     Breath sounds: Normal breath sounds.  Chest:     Comments: No significant tenderness to palpation of the chest wall. Abdominal:     General: Bowel sounds are normal.     Palpations: Abdomen is soft.  Skin:    General: Skin is warm and dry.     Capillary Refill: Capillary refill takes less than 2 seconds.  Neurological:     Mental Status: He is alert and oriented to person, place, and time.     Comments: CN II through XII grossly intact, moves all 4 limbs spontaneously, patient can ambulate without difficulty.  Psychiatric:        Mood and Affect: Mood normal.        Behavior: Behavior  normal.     ED Results / Procedures / Treatments   Labs (all labs ordered are listed, but only abnormal results are displayed) Labs Reviewed  BASIC METABOLIC PANEL - Abnormal; Notable for the following components:      Result Value   Glucose, Bld 109 (*)    All other components within normal limits  CBC - Abnormal; Notable for the following components:   RBC 5.97 (*)    All other components within normal limits  TROPONIN I (HIGH SENSITIVITY)    EKG EKG Interpretation  Date/Time:  Wednesday January 01 2022 15:14:59 EST Ventricular Rate:  60 PR Interval:  143 QRS Duration: 80 QT Interval:  383 QTC Calculation: 383 R Axis:   48 Text Interpretation: Sinus rhythm ST elev, probable normal early repol pattern Baseline wander in lead(s) V3 since last tracing no significant change  Confirmed by Rolan Bucco [phone]) on 01/01/2022 4:16:25 PM  Radiology DG Chest 2 View  Result Date: 01/01/2022 CLINICAL DATA:  Chest pain. EXAM: CHEST - 2 VIEW COMPARISON:  None Available. FINDINGS: The heart size and mediastinal contours are within normal limits. Both lungs are clear. The visualized skeletal structures are unremarkable. IMPRESSION: No active cardiopulmonary disease. Electronically Signed   By: Lupita Raider M.D.   On: 01/01/2022 15:15    Procedures Procedures    Medications Ordered in ED Medications - No data to display  ED Course/ Medical Decision Making/ A&P                           Medical Decision Making Amount and/or Complexity of Data Reviewed Labs: ordered. Radiology: ordered.   This patient is a 56 y.o. male  who presents to the ED for concern of chest pain, headache.   Differential diagnoses prior to evaluation: The emergent differential diagnosis includes, but is not limited to,  ACS, AAS, PE, Mallory-Weiss, Boerhaave's, Pneumonia, acute bronchitis, asthma or COPD exacerbation, anxiety, MSK pain or traumatic injury to the chest, acid reflux versus other, Intracranial hemorrhage, meningitis, CVA, intracranial tumor, venous sinus thrombosis, migraine, cluster headache, hypertension, drug related, pseudotumor cerebri, AVM, head injury, tension headache, sinusitis, dental abscess, otitis media, TMJ  .   This is not an exhaustive differential.   Past Medical History / Co-morbidities: Patient with previous history of poorly controlled hypertension, acid reflux, denies previous history of diabetes, high cholesterol, tobacco abuse, does endorse alcohol use  Physical Exam: Physical exam performed. The pertinent findings include: On exam patient with no focal neurologic deficits, no significant tenderness palpation of chest wall, normal heart and lung sounds on my exam.  Lab Tests/Imaging studies: I personally interpreted labs/imaging and the pertinent  results include: Troponin negative x 1 in context of no active chest pain for the last 6 hours.  He unremarkable, patient with high normal creatinine at 1.09, no recent baseline on file, his glucose is mildly elevated on nonfasting lab value at 109.  CBC is unremarkable..  Independently interpreted clinical chest x-ray which shows no acute intrathoracic abnormality I agree with the radiologist interpretation.  Cardiac monitoring: EKG obtained and interpreted by my attending physician which shows: Normal sinus rhythm, some ST elevation likely from early repole, on comparison to previous EKG no significant change noted.   Medications: I have reviewed the patients home medicines and have made adjustments as needed.  Given patient has poorly controlled hypertension, and symptoms that do seem somewhat consistent with acid reflux I think that it would  be reasonable to discharge him with blood pressure medicine, close PCP follow-up, as well as acid reflux medicine.  Given his cardiac risk factors, poorly controlled hypertension, and intermittent chest pain I do think that he would benefit from outpatient cardiology eval, but do not think that he necessitates admission at this time.  Heart score of 3.   Disposition: After consideration of the diagnostic results and the patients response to treatment, I feel that patient would benefit from outpatient cardiology follow-up, hypertension medicine, acid reflux medication as discussed above.  He is stable for discharge at this time with extensive return precautions given.  emergency department workup does not suggest an emergent condition requiring admission or immediate intervention beyond what has been performed at this time. The plan is: as above. The patient is safe for discharge and has been instructed to return immediately for worsening symptoms, change in symptoms or any other concerns.  I discussed this case with my attending physician who cosigned this note  including patient's presenting symptoms, physical exam, and planned diagnostics and interventions. Attending physician stated agreement with plan or made changes to plan which were implemented.   Final Clinical Impression(s) / ED Diagnoses Final diagnoses:  Poorly-controlled hypertension  Chest pain, unspecified type  Gastroesophageal reflux disease without esophagitis    Rx / DC Orders ED Discharge Orders          Ordered    omeprazole (PRILOSEC) 20 MG capsule  Daily        01/01/22 1635    hydrochlorothiazide (HYDRODIURIL) 25 MG tablet  Daily        01/01/22 1635    Ambulatory referral to Cardiology       Comments: If you have not heard from the Cardiology office within the next 72 hours please call [phone].   01/01/22 1635              Berry Godsey, Harrel Carina, PA-C 01/01/22 1844    Rolan Bucco, MD 01/01/22 2258

## 2022-01-14 NOTE — Progress Notes (Deleted)
Cardiology Office Note:   Date:  01/14/2022  NAME:  Shaun Noble    MRN: [id number] DOB:  04/24/1965   PCP:  Elisabeth Cara, PA-C  Cardiologist:  None  Electrophysiologist:  None   Referring MD: Elisabeth Cara, *   No chief complaint on file. ***  History of Present Illness:   Shaun Noble is a 57 y.o. male with a hx of HTN, GERD who is being seen today for the evaluation of abnormal EKG at the request of [place], [place], [place].  Problem List HTN GERD  Past Medical History: Past Medical History:  Diagnosis Date   GERD (gastroesophageal reflux disease)    Hypertension     Past Surgical History: No past surgical history on file.  Current Medications: No outpatient medications have been marked as taking for the 01/17/22 encounter (Appointment) with O'Neal, Cassie Freer, MD.     Allergies:    Patient has no known allergies.   Social History: Social History   Socioeconomic History   Marital status: Single    Spouse name: Not on file   Number of children: Not on file   Years of education: Not on file   Highest education level: Not on file  Occupational History   Not on file  Tobacco Use   Smoking status: Never   Smokeless tobacco: Not on file  Substance and Sexual Activity   Alcohol use: Yes   Drug use: No   Sexual activity: Not on file  Other Topics Concern   Not on file  Social History Narrative   Not on file   Social Determinants of Health   Financial Resource Strain: Not on file  Food Insecurity: Not on file  Transportation Needs: Not on file  Physical Activity: Not on file  Stress: Not on file  Social Connections: Not on file     Family History: The patient's ***family history is not on file.  ROS:   All other ROS reviewed and negative. Pertinent positives noted in the HPI.     EKGs/Labs/Other Studies Reviewed:   The following studies were personally reviewed by me today:  EKG:  EKG is *** ordered today.  The ekg  ordered today demonstrates ***, and was personally reviewed by me.   Recent Labs: 01/01/2022: BUN 17; Creatinine, Ser 1.09; Hemoglobin 16.7; Platelets 260; Potassium 4.2; Sodium 135   Recent Lipid Panel No results found for: "CHOL", "TRIG", "HDL", "CHOLHDL", "VLDL", "LDLCALC", "LDLDIRECT"  Physical Exam:   VS:  There were no vitals taken for this visit.   Wt Readings from Last 3 Encounters:  01/01/22 267 lb (121.1 kg)  06/09/12 240 lb (108.9 kg)    General: Well nourished, well developed, in no acute distress Head: Atraumatic, normal size  Eyes: PEERLA, EOMI  Neck: Supple, no JVD Endocrine: No thryomegaly Cardiac: Normal S1, S2; RRR; no murmurs, rubs, or gallops Lungs: Clear to auscultation bilaterally, no wheezing, rhonchi or rales  Abd: Soft, nontender, no hepatomegaly  Ext: No edema, pulses 2+ Musculoskeletal: No deformities, BUE and BLE strength normal and equal Skin: Warm and dry, no rashes   Neuro: Alert and oriented to person, place, time, and situation, CNII-XII grossly intact, no focal deficits  Psych: Normal mood and affect   ASSESSMENT:   Shaun Noble is a 57 y.o. male who presents for the following: No diagnosis found.  PLAN:   There are no diagnoses linked to this encounter.  {Are you ordering a CV Procedure (e.g. stress  test, cath, DCCV, TEE, etc)?   Press F2        :[id number]  Disposition: No follow-ups on file.  Medication Adjustments/Labs and Tests Ordered: Current medicines are reviewed at length with the patient today.  Concerns regarding medicines are outlined above.  No orders of the defined types were placed in this encounter.  No orders of the defined types were placed in this encounter.   There are no Patient Instructions on file for this visit.   Time Spent with Patient: I have spent a total of *** minutes with patient reviewing hospital notes, telemetry, EKGs, labs and examining the patient as well as establishing an assessment and plan  that was discussed with the patient.  > 50% of time was spent in direct patient care.  Signed, Addison Naegeli. Audie Box, MD, [place]  [address] [place], [place] [address] [phone]  01/14/2022 6:59 PM

## 2022-01-17 ENCOUNTER — Ambulatory Visit: Payer: 59 | Admitting: Cardiovascular Disease

## 2022-01-17 DIAGNOSIS — R9431 Abnormal electrocardiogram [ECG] [EKG]: Secondary | ICD-10-CM

## 2022-01-17 DIAGNOSIS — R079 Chest pain, unspecified: Secondary | ICD-10-CM

## 2022-01-17 DIAGNOSIS — I10 Essential (primary) hypertension: Secondary | ICD-10-CM

## 2022-01-21 ENCOUNTER — Encounter: Payer: Self-pay | Admitting: Cardiovascular Disease

## 2023-01-08 ENCOUNTER — Other Ambulatory Visit: Payer: Self-pay

## 2023-01-08 ENCOUNTER — Encounter (HOSPITAL_COMMUNITY): Payer: Self-pay | Admitting: Emergency Medicine

## 2023-01-08 ENCOUNTER — Emergency Department (HOSPITAL_COMMUNITY): Payer: 59

## 2023-01-08 ENCOUNTER — Emergency Department (HOSPITAL_COMMUNITY)
Admission: EM | Admit: 2023-01-08 | Discharge: 2023-01-08 | Disposition: A | Discharge status: Home / Self Care | Payer: 59 | Attending: Emergency Medicine | Admitting: Emergency Medicine

## 2023-01-08 DIAGNOSIS — R079 Chest pain, unspecified: Secondary | ICD-10-CM | POA: Insufficient documentation

## 2023-01-08 DIAGNOSIS — F419 Anxiety disorder, unspecified: Secondary | ICD-10-CM | POA: Diagnosis not present

## 2023-01-08 DIAGNOSIS — I10 Essential (primary) hypertension: Secondary | ICD-10-CM | POA: Diagnosis not present

## 2023-01-08 DIAGNOSIS — Z79899 Other long term (current) drug therapy: Secondary | ICD-10-CM | POA: Diagnosis not present

## 2023-01-08 LAB — CBC
HCT: 49.3 % (ref 39.0–52.0)
Hemoglobin: 16.7 g/dL (ref 13.0–17.0)
MCH: 28.2 pg (ref 26.0–34.0)
MCHC: 33.9 g/dL (ref 30.0–36.0)
MCV: 83.3 fL (ref 80.0–100.0)
Platelets: 268 10*3/uL (ref 150–400)
RBC: 5.92 MIL/uL — ABNORMAL HIGH (ref 4.22–5.81)
RDW: 13.2 % (ref 11.5–15.5)
WBC: 10.8 10*3/uL — ABNORMAL HIGH (ref 4.0–10.5)
nRBC: 0 % (ref 0.0–0.2)

## 2023-01-08 LAB — BASIC METABOLIC PANEL
Anion gap: 10 (ref 5–15)
BUN: 14 mg/dL (ref 6–20)
CO2: 27 mmol/L (ref 22–32)
Calcium: 9.3 mg/dL (ref 8.9–10.3)
Chloride: 98 mmol/L (ref 98–111)
Creatinine, Ser: 1.06 mg/dL (ref 0.61–1.24)
GFR, Estimated: >60 mL/min (ref >60)
Glucose, Bld: 128 mg/dL — ABNORMAL HIGH (ref 70–99)
Potassium: 3.8 mmol/L (ref 3.5–5.1)
Sodium: 135 mmol/L (ref 135–145)

## 2023-01-08 LAB — TROPONIN I (HIGH SENSITIVITY)
Troponin I (High Sensitivity): 5 ng/L (ref <18)
Troponin I (High Sensitivity): 3 ng/L (ref <18)

## 2023-01-08 MED ORDER — HYDROXYZINE HCL 25 MG PO TABS: 25.0000 mg | ORAL_TABLET | Freq: Four times a day (QID) | ORAL | 0 refills | Status: AC | PRN

## 2023-01-08 MED ORDER — AMLODIPINE BESYLATE 10 MG PO TABS: 10.0000 mg | ORAL_TABLET | Freq: Every day | ORAL | 1 refills | Status: AC

## 2023-01-08 MED ORDER — ATORVASTATIN CALCIUM 20 MG PO TABS: 10.0000 mg | ORAL_TABLET | Freq: Every day | ORAL | 1 refills | Status: AC

## 2023-01-08 NOTE — ED Triage Notes (Signed)
 Pt from home reports starting having some chest tightness pain on Sunday and has continued.  Pt states he started on new BP medication and since then "my anxiety has been way up."  Pain comes and goes, none at this time.

## 2023-01-08 NOTE — ED Notes (Signed)
 Patient verbalizes understanding of discharge instructions. Opportunity for questioning and answers were provided. Pt discharged from ED.

## 2023-01-08 NOTE — Discharge Instructions (Addendum)
 I have placed a referral to cardiology for you to follow-up, please return to the emergency department however if you are having severe chest pain, shortness of breath before you are able to follow-up with a cardiologist.  I have prescribed a new medication which can take up to once every 6 hours to help with anxiety.  Additionally I have split up your prescriptions for your blood pressure and cholesterol medication.  You can try not taking 1 4 a few days to see if it affects your anxiety and other bodily symptoms, and if you do notice that 1 of these medications seems to be causing your symptoms you can talk to your primary care doctor about making a long-term adjustment to your blood pressure or cholesterol medication.

## 2023-01-08 NOTE — ED Provider Notes (Signed)
 [facility] Provider Note   CSN: [id number] Arrival date & time: 01/08/23  0353     History  Chief Complaint  Patient presents with   Chest Pain    Shaun Noble is a 58 y.o. male past medical history significant for hypertension, hyperlipidemia, GERD who presents concern for some chest tightness on Sunday that has continued intermittently for the last couple of days.  He reports that he started a new blood pressure medication and thinks that is affecting his anxiety.  He reports that it is a combination atorvastatin , amlodipine  medication.  He has not tried not taking it and seeing how he feels.  He has not talked to his primary care doctor about new symptoms related to medication change.   Chest Pain      Home Medications Prior to Admission medications   Medication Sig Start Date End Date Taking? Authorizing Provider  amLODipine  (NORVASC ) 10 MG tablet Take 1 tablet (10 mg total) by mouth daily. 01/08/23  Yes Chaise Passarella H, PA-C  atorvastatin  (LIPITOR) 20 MG tablet Take 0.5 tablets (10 mg total) by mouth daily. 01/08/23  Yes Raine Blodgett H, PA-C  hydrOXYzine  (ATARAX ) 25 MG tablet Take 1 tablet (25 mg total) by mouth every 6 (six) hours as needed for anxiety. 01/08/23  Yes Journie Howson H, PA-C  hydrochlorothiazide  (HYDRODIURIL ) 25 MG tablet Take 1 tablet (25 mg total) by mouth daily. 01/01/22   Sansa Alkema H, PA-C  omeprazole  (PRILOSEC) 20 MG capsule Take 1 capsule (20 mg total) by mouth daily. 01/01/22   Victory Dresden H, PA-C      Allergies    Patient has no known allergies.    Review of Systems   Review of Systems  Cardiovascular:  Positive for chest pain.  All other systems reviewed and are negative.   Physical Exam Updated Vital Signs BP (!) 161/95 (BP Location: Left Arm)   Pulse 75   Temp 97.7 F (36.5 C)   Resp 18   Ht 6' 1 (1.854 m)   Wt 117 kg   SpO2 100%   BMI 34.04 kg/m   Physical Exam Vitals and nursing note reviewed.  Constitutional:      General: He is not in acute distress.    Appearance: Normal appearance.  HENT:     Head: Normocephalic and atraumatic.  Eyes:     General:        Right eye: No discharge.        Left eye: No discharge.  Cardiovascular:     Rate and Rhythm: Normal rate and regular rhythm.     Heart sounds: No murmur heard.    No friction rub. No gallop.  Pulmonary:     Effort: Pulmonary effort is normal.     Breath sounds: Normal breath sounds.     Comments: No wheezing, rhonchi, stridor, rales Abdominal:     General: Bowel sounds are normal.     Palpations: Abdomen is soft.  Skin:    General: Skin is warm and dry.     Capillary Refill: Capillary refill takes less than 2 seconds.  Neurological:     Mental Status: He is alert and oriented to person, place, and time.  Psychiatric:        Mood and Affect: Mood normal.        Behavior: Behavior normal.     ED Results / Procedures / Treatments   Labs (all labs ordered are listed, but only  abnormal results are displayed) Labs Reviewed  BASIC METABOLIC PANEL - Abnormal; Notable for the following components:      Result Value   Glucose, Bld 128 (*)    All other components within normal limits  CBC - Abnormal; Notable for the following components:   WBC 10.8 (*)    RBC 5.92 (*)    All other components within normal limits  TROPONIN I (HIGH SENSITIVITY)  TROPONIN I (HIGH SENSITIVITY)    EKG None  Radiology DG Chest 2 View Result Date: 01/08/2023 CLINICAL DATA:  Chest pain EXAM: CHEST - 2 VIEW COMPARISON:  01/01/2022 FINDINGS: Normal heart size and mediastinal contours. No acute infiltrate or edema. No effusion or pneumothorax. No acute osseous findings. IMPRESSION: Negative chest. Electronically Signed   By: Dorn Roulette M.D.   On: 01/08/2023 04:15    Procedures Procedures    Medications Ordered in ED Medications - No data to display  ED Course/ Medical  Decision Making/ A&P                                 Medical Decision Making Amount and/or Complexity of Data Reviewed Labs: ordered. Radiology: ordered.   This patient is a 58 y.o. male  who presents to the ED for concern of chest pain, anxiety.   Differential diagnoses prior to evaluation: The emergent differential diagnosis includes, but is not limited to,  ACS, AAS, PE, Mallory-Weiss, Boerhaave's, Pneumonia, acute bronchitis, asthma or COPD exacerbation, anxiety, MSK pain or traumatic injury to the chest, acid reflux versus other . This is not an exhaustive differential.   Past Medical History / Co-morbidities / Social History: Hypertension, hyperlipidemia, GERD  Additional history: Chart reviewed. Pertinent results include: No strong family history of ACS, no previous cardiac evaluation or echo  Physical Exam: Physical exam performed. The pertinent findings include: Somewhat hypertensive in the ED, blood pressure 161/95 at time my evaluation, other vital signs stable, afebrile, stable oxygen saturation on room air, breath sounds.  Lab Tests/Imaging studies: I personally interpreted labs/imaging and the pertinent results include: CBC with mild leukocytosis, blood cells 10.8.  His BMP is unremarkable.  Troponin negative x 2.  I independently interpreted plain film chest x-ray which shows no evidence of acute intrathoracic abnormality.. I agree with the radiologist interpretation.  Cardiac monitoring: EKG obtained and interpreted by myself and attending physician which shows: Nonspecific T wave change, but no ST segment abnormalities.   Medications: I ordered medication including discussed with the patient that I can prescribe him some hydroxyzine  for anxiety which he reports has increased recently, and I will separate his combination amlodipine /atorvastatin  medication, he can try holding one of the medications for a few days to see if it affects his symptoms, but I discussed that he  do this in close conjunction with his primary care doctor.  Patient understands agrees to plan, ambulatory referral to cardiology placed.  Overall his symptoms are atypical, I do think it is suspicious for GERD presentation especially since his symptoms improved with GI cocktail administered at urgent care, but given his age and other risk factors I do think that he would benefit from close cardiology evaluation, heart score of 3..  I have reviewed the patients home medicines and have made adjustments as needed.   Disposition: After consideration of the diagnostic results and the patients response to treatment, I feel that patient is stable for discharge, plan for cardiology follow-up, PCP follow-up  as discussed above.   emergency department workup does not suggest an emergent condition requiring admission or immediate intervention beyond what has been performed at this time. The plan is: as above. The patient is safe for discharge and has been instructed to return immediately for worsening symptoms, change in symptoms or any other concerns.  Final Clinical Impression(s) / ED Diagnoses Final diagnoses:  Chest pain, unspecified type  Anxiety    Rx / DC Orders ED Discharge Orders          Ordered    Ambulatory referral to Cardiology        01/08/23 1358    amLODipine  (NORVASC ) 10 MG tablet  Daily        01/08/23 1358    atorvastatin  (LIPITOR) 20 MG tablet  Daily        01/08/23 1358    hydrOXYzine  (ATARAX ) 25 MG tablet  Every 6 hours PRN        01/08/23 1358              Mechel Haggard, [place] H, PA-C 01/08/23 1419    Doretha Folks, MD 01/08/23 1454

## 2023-03-30 NOTE — Progress Notes (Unsigned)
 Cardiology Office Note:    Date:  04/03/2023   ID:  Shaun Noble, DOB 1965-03-03, MRN [id number]  PCP:  Shaun Jungling, NP   [facility] Providers Cardiologist:  None     Referring MD: Burnis Medin, *   Chief Complaint  Patient presents with   Chest Pain    History of Present Illness:    Shaun Noble is a 58 y.o. male seen at the request of [place], PA-C for evaluation of chest pain. He has a history of HTN, HLD and prediabetes. He states he was seen 2 weeks ago with pain in his left shoulder with numbness in his left arm and fingers. Sometimes he will get a sharp pain in his chest - very brief. Symptoms are worse with position particularly extension of his neck.  He reports prior cardiac evaluation in 2002. There was concerned based on Ecg that he was having a heart attack. Was admitted overnight and enzymes were negative. Had subsequent Echo and nuclear stress test as outpatient that were normal.   Past Medical History:  Diagnosis Date   GERD (gastroesophageal reflux disease)    Hypercholesteremia    Hypertension    Prediabetes     History reviewed. No pertinent surgical history.  Current Medications: Current Meds  Medication Sig   amLODipine (NORVASC) 10 MG tablet Take 1 tablet (10 mg total) by mouth daily.   atorvastatin (LIPITOR) 20 MG tablet Take 0.5 tablets (10 mg total) by mouth daily.   hydrochlorothiazide (HYDRODIURIL) 25 MG tablet Take 1 tablet (25 mg total) by mouth daily.   hydrOXYzine (ATARAX) 25 MG tablet Take 1 tablet (25 mg total) by mouth every 6 (six) hours as needed for anxiety.   omeprazole (PRILOSEC) 20 MG capsule Take 1 capsule (20 mg total) by mouth daily.     Allergies:   Patient has no known allergies.   Social History   Socioeconomic History   Marital status: Single    Spouse name: Not on file   Number of children: Not on file   Years of education: Not on file   Highest education level: Not on file   Occupational History   Not on file  Tobacco Use   Smoking status: Never   Smokeless tobacco: Not on file  Substance and Sexual Activity   Alcohol use: Yes   Drug use: No   Sexual activity: Not on file  Other Topics Concern   Not on file  Social History Narrative   Not on file   Social Drivers of Health   Financial Resource Strain: Low Risk  (02/05/2023)   Received from [facility]   Overall Financial Resource Strain (CARDIA)    Difficulty of Paying Living Expenses: Not very hard  Food Insecurity: Food Insecurity Present (02/05/2023)   Received from [facility]   Hunger Vital Sign    Worried About Running Out of Food in the Last Year: Sometimes true    Ran Out of Food in the Last Year: Patient declined  Transportation Needs: No Transportation Needs (02/05/2023)   Received from [facility] - Transportation    Lack of Transportation (Medical): No    Lack of Transportation (Non-Medical): No  Physical Activity: Insufficiently Active (02/05/2023)   Received from [facility]   Exercise Vital Sign    Days of Exercise per Week: 3 days    Minutes of Exercise per Session: 20 min  Stress: No Stress Concern Present (02/05/2023)   Received  from [facility] - Occupational Stress Questionnaire    Feeling of Stress : Only a little  Social Connections: Socially Integrated (02/05/2023)   Received from [facility]   Social Network    How would you rate your social network (family, work, friends)?: Good participation with social networks     Family History: The patient's family history is negative for Heart attack and Heart disease.  ROS:   Please see the history of present illness.     All other systems reviewed and are negative.  EKGs/Labs/Other Studies Reviewed:    The following studies were reviewed today: EKG Interpretation Date/Time:  Friday April 03 2023 08:50:05 EDT Ventricular Rate:  60 PR Interval:  138 QRS  Duration:  92 QT Interval:  386 QTC Calculation: 386 R Axis:   17  Text Interpretation: Normal sinus rhythm Early repolarization When compared with ECG of 08-Jan-2023 03:33, T wave inversion no longer evident in Anterior leads Confirmed by [place], Katanya Schlie [phone]) on 04/03/2023 8:51:30 AM   EKG Interpretation Date/Time:  Friday April 03 2023 08:50:05 EDT Ventricular Rate:  60 PR Interval:  138 QRS Duration:  92 QT Interval:  386 QTC Calculation: 386 R Axis:   17  Text Interpretation: Normal sinus rhythm Early repolarization When compared with ECG of 08-Jan-2023 03:33, T wave inversion no longer evident in Anterior leads Confirmed by [place], Makenzi Bannister [phone]) on 04/03/2023 8:51:30 AM    Recent Labs: 01/08/2023: BUN 14; Creatinine, Ser 1.06; Hemoglobin 16.7; Platelets 268; Potassium 3.8; Sodium 135  Recent Lipid Panel No results found for: "CHOL", "TRIG", "HDL", "CHOLHDL", "VLDL", "LDLCALC", "LDLDIRECT"  Dated 01/13/22: cholesterol 273, triglycerides 91, HDL 71, LDL 187. A1c 6%.   Risk Assessment/Calculations:                Physical Exam:    VS:  BP 130/70   Pulse 60   Ht 6\' 1"  (1.854 m)   Wt 262 lb 9.6 oz (119.1 kg)   SpO2 97%   BMI 34.65 kg/m     Wt Readings from Last 3 Encounters:  04/03/23 262 lb 9.6 oz (119.1 kg)  01/08/23 258 lb (117 kg)  01/01/22 267 lb (121.1 kg)     GEN:  Well nourished, well developed in no acute distress HEENT: Normal NECK: No JVD; No carotid bruits LYMPHATICS: No lymphadenopathy CARDIAC: RRR, no murmurs, rubs, gallops RESPIRATORY:  Clear to auscultation without rales, wheezing or rhonchi  ABDOMEN: Soft, non-tender, non-distended MUSCULOSKELETAL:  No edema; No deformity  SKIN: Warm and dry NEUROLOGIC:  Alert and oriented x 3 PSYCHIATRIC:  Normal affect   ASSESSMENT:    1. Chest pain of uncertain etiology   2. Atypical chest pain   3. Primary hypertension   4. Hypercholesteremia    PLAN:    In order of problems listed  above:  Atypical left should pain. Symptoms most c/w cervical radiculopathy. Ecg shows changes of early repolarization which is a normal variant and is chronic. I don't feel further CV evaluation warranted HLD on atorvastatin. Recommend he have follow up with PCP to make sure he is at goal on atorvastatin. Encourage heart healthy diet and regular aerobic exercise.  HTN well controlled.            Medication Adjustments/Labs and Tests Ordered: Current medicines are reviewed at length with the patient today.  Concerns regarding medicines are outlined above.  Orders Placed This Encounter  Procedures   EKG 12-Lead   No  orders of the defined types were placed in this encounter.   Patient Instructions  Medication Instructions:  Continue same medications  Lab Work: None ordered  Testing/Procedures: None ordered  Follow-Up: At [facility], you and your health needs are our priority.  As part of our continuing mission to provide you with exceptional heart care, our providers are all part of one team.  This team includes your primary Cardiologist (physician) and Advanced Practice Providers or APPs (Physician Assistants and Nurse Practitioners) who all work together to provide you with the care you need, when you need it.  Your next appointment:  As Needed    Provider:  Dr.Demosthenes Virnig   We recommend signing up for the patient portal called "MyChart".  Sign up information is provided on this After Visit Summary.  MyChart is used to connect with patients for Virtual Visits (Telemedicine).  Patients are able to view lab/test results, encounter notes, upcoming appointments, etc.  Non-urgent messages can be sent to your provider as well.   To learn more about what you can do with MyChart, go to [email or web address].        1st Floor: - Lobby - Registration  - Pharmacy  - Lab - Cafe  2nd Floor: - PV Lab - Diagnostic Testing (echo, CT, nuclear med)  3rd Floor: -  Vacant  4th Floor: - TCTS (cardiothoracic surgery) - AFib Clinic - Structural Heart Clinic - Vascular Surgery  - Vascular Ultrasound  5th Floor: - HeartCare Cardiology (general and EP) - Clinical Pharmacy for coumadin, hypertension, lipid, weight-loss medications, and med management appointments    Valet parking services will be available as well.      Signed, Kilee Hedding [place], MD  04/03/2023 9:07 AM    [facility]

## 2023-04-03 ENCOUNTER — Encounter: Payer: Self-pay | Admitting: Cardiology

## 2023-04-03 ENCOUNTER — Ambulatory Visit: Payer: 59 | Attending: Cardiology | Admitting: Cardiology

## 2023-04-03 VITALS — BP 130/70 | HR 60 | Ht 73.0 in | Wt 262.6 lb

## 2023-04-03 DIAGNOSIS — R0789 Other chest pain: Secondary | ICD-10-CM

## 2023-04-03 DIAGNOSIS — I10 Essential (primary) hypertension: Secondary | ICD-10-CM | POA: Diagnosis not present

## 2023-04-03 DIAGNOSIS — E7800 Pure hypercholesterolemia, unspecified: Secondary | ICD-10-CM

## 2023-04-03 DIAGNOSIS — R079 Chest pain, unspecified: Secondary | ICD-10-CM | POA: Diagnosis not present

## 2023-04-03 NOTE — Patient Instructions (Signed)
 Medication Instructions:  Continue same medications  Lab Work: None ordered  Testing/Procedures: None ordered  Follow-Up: At [facility], you and your health needs are our priority.  As part of our continuing mission to provide you with exceptional heart care, our providers are all part of one team.  This team includes your primary Cardiologist (physician) and Advanced Practice Providers or APPs (Physician Assistants and Nurse Practitioners) who all work together to provide you with the care you need, when you need it.  Your next appointment:  As Needed    Provider:  Dr.Jordan   We recommend signing up for the patient portal called "MyChart".  Sign up information is provided on this After Visit Summary.  MyChart is used to connect with patients for Virtual Visits (Telemedicine).  Patients are able to view lab/test results, encounter notes, upcoming appointments, etc.  Non-urgent messages can be sent to your provider as well.   To learn more about what you can do with MyChart, go to [email or web address].        1st Floor: - Lobby - Registration  - Pharmacy  - Lab - Cafe  2nd Floor: - PV Lab - Diagnostic Testing (echo, CT, nuclear med)  3rd Floor: - Vacant  4th Floor: - TCTS (cardiothoracic surgery) - AFib Clinic - Structural Heart Clinic - Vascular Surgery  - Vascular Ultrasound  5th Floor: - HeartCare Cardiology (general and EP) - Clinical Pharmacy for coumadin, hypertension, lipid, weight-loss medications, and med management appointments    Valet parking services will be available as well.

## 2023-10-12 ENCOUNTER — Other Ambulatory Visit: Payer: Self-pay

## 2023-10-12 ENCOUNTER — Emergency Department (HOSPITAL_COMMUNITY)

## 2023-10-12 ENCOUNTER — Encounter (HOSPITAL_COMMUNITY): Payer: Self-pay

## 2023-10-12 ENCOUNTER — Emergency Department (HOSPITAL_COMMUNITY)
Admission: EM | Admit: 2023-10-12 | Discharge: 2023-10-12 | Disposition: A | Discharge status: Home / Self Care | Attending: Emergency Medicine | Admitting: Emergency Medicine

## 2023-10-12 DIAGNOSIS — I10 Essential (primary) hypertension: Secondary | ICD-10-CM | POA: Insufficient documentation

## 2023-10-12 DIAGNOSIS — M546 Pain in thoracic spine: Secondary | ICD-10-CM | POA: Insufficient documentation

## 2023-10-12 DIAGNOSIS — R0789 Other chest pain: Secondary | ICD-10-CM | POA: Insufficient documentation

## 2023-10-12 DIAGNOSIS — Z79899 Other long term (current) drug therapy: Secondary | ICD-10-CM | POA: Diagnosis not present

## 2023-10-12 LAB — CBC
HCT: 51.4 % (ref 39.0–52.0)
Hemoglobin: 16.2 g/dL (ref 13.0–17.0)
MCH: 26.7 pg (ref 26.0–34.0)
MCHC: 31.5 g/dL (ref 30.0–36.0)
MCV: 84.8 fL (ref 80.0–100.0)
Platelets: 263 K/uL (ref 150–400)
RBC: 6.06 MIL/uL — ABNORMAL HIGH (ref 4.22–5.81)
RDW: 13.6 % (ref 11.5–15.5)
WBC: 10.4 K/uL (ref 4.0–10.5)
nRBC: 0 % (ref 0.0–0.2)

## 2023-10-12 LAB — COMPREHENSIVE METABOLIC PANEL WITH GFR
ALT: 61 U/L — ABNORMAL HIGH (ref 0–44)
AST: 41 U/L (ref 15–41)
Albumin: 4.5 g/dL (ref 3.5–5.0)
Alkaline Phosphatase: 60 U/L (ref 38–126)
Anion gap: 12 (ref 5–15)
BUN: 15 mg/dL (ref 6–20)
CO2: 23 mmol/L (ref 22–32)
Calcium: 9.6 mg/dL (ref 8.9–10.3)
Chloride: 102 mmol/L (ref 98–111)
Creatinine, Ser: 1.07 mg/dL (ref 0.61–1.24)
GFR, Estimated: >60 mL/min (ref >60)
Glucose, Bld: 104 mg/dL — ABNORMAL HIGH (ref 70–99)
Potassium: 4.3 mmol/L (ref 3.5–5.1)
Sodium: 137 mmol/L (ref 135–145)
Total Bilirubin: 0.6 mg/dL (ref 0.0–1.2)
Total Protein: 7.6 g/dL (ref 6.5–8.1)

## 2023-10-12 LAB — LIPASE, BLOOD: Lipase: 19 U/L (ref 11–51)

## 2023-10-12 LAB — TROPONIN T, HIGH SENSITIVITY
Troponin T High Sensitivity: <15 ng/L (ref 0–19)
Troponin T High Sensitivity: <15 ng/L (ref 0–19)

## 2023-10-12 LAB — MAGNESIUM: Magnesium: 2.1 mg/dL (ref 1.7–2.4)

## 2023-10-12 NOTE — ED Provider Notes (Signed)
 [place] EMERGENCY DEPARTMENT AT [facility] Provider Note   CSN: [id number] Arrival date & time: 10/12/23  1049     Patient presents with: Chest Pain   Shaun Noble is a 58 y.o. male.   HPI Patient with history of hypertension, hyperlipidemia presents with an episode of right infrascapular and anterior thoracic pain.  He notes that he has had episodes of pain in the right infrascapular region intermittently for some time.  However, he does not typically have associated chest pain.  Today, after developing typical pain he began to have pain in the anterior chest as well.  He took 2 aspirin, attempted to bring himself for evaluation, but due to pain he stopped, called EMS. Currently he notes the chest pain is essentially resolved.  He hypothesizes it may have been heartburn as he has history of this as well. He has prior cardiac evaluation in the distant past, but no known cardiac disease.    Prior to Admission medications   Medication Sig Start Date End Date Taking? Authorizing Provider  amLODipine  (NORVASC ) 10 MG tablet Take 1 tablet (10 mg total) by mouth daily. 01/08/23   Prosperi, Christian H, PA-C  atorvastatin  (LIPITOR) 20 MG tablet Take 0.5 tablets (10 mg total) by mouth daily. 01/08/23   Prosperi, Christian H, PA-C  hydrochlorothiazide  (HYDRODIURIL ) 25 MG tablet Take 1 tablet (25 mg total) by mouth daily. 01/01/22   Prosperi, Christian H, PA-C  hydrOXYzine  (ATARAX ) 25 MG tablet Take 1 tablet (25 mg total) by mouth every 6 (six) hours as needed for anxiety. 01/08/23   Prosperi, Christian H, PA-C  omeprazole  (PRILOSEC) 20 MG capsule Take 1 capsule (20 mg total) by mouth daily. 01/01/22   Prosperi, Christian H, PA-C    Allergies: Patient has no known allergies.    Review of Systems  Updated Vital Signs BP (!) 149/78   Pulse (!) 58   Temp 98.7 F (37.1 C)   Resp 19   Ht 1.854 m (6' 1)   Wt 119.3 kg   SpO2 94%   BMI 34.70 kg/m   Physical Exam Vitals and  nursing note reviewed.  Constitutional:      General: He is not in acute distress.    Appearance: He is well-developed.  HENT:     Head: Normocephalic and atraumatic.  Eyes:     Conjunctiva/sclera: Conjunctivae normal.  Cardiovascular:     Rate and Rhythm: Normal rate and regular rhythm.  Pulmonary:     Effort: Pulmonary effort is normal. No respiratory distress.     Breath sounds: No stridor.  Abdominal:     General: There is no distension.  Skin:    General: Skin is warm and dry.  Neurological:     Mental Status: He is alert and oriented to person, place, and time.     (all labs ordered are listed, but only abnormal results are displayed) Labs Reviewed  CBC - Abnormal; Notable for the following components:      Result Value   RBC 6.06 (*)    All other components within normal limits  COMPREHENSIVE METABOLIC PANEL WITH GFR - Abnormal; Notable for the following components:   Glucose, Bld 104 (*)    ALT 61 (*)    All other components within normal limits  MAGNESIUM  LIPASE, BLOOD  TROPONIN T, HIGH SENSITIVITY  TROPONIN T, HIGH SENSITIVITY    EKG: EKG Interpretation Date/Time:  Monday October 12 2023 12:33:55 EDT Ventricular Rate:  59 PR Interval:  141 QRS Duration:  92 QT Interval:  385 QTC Calculation: 382 R Axis:   52  Text Interpretation: Sinus rhythm Borderline T abnormalities, inferior leads Confirmed by Garrick Charleston [phone]) on 10/12/2023 3:10:13 PM  Radiology: DG Chest Portable 1 View Result Date: 10/12/2023 CLINICAL DATA:  Chest pain EXAM: PORTABLE CHEST 1 VIEW COMPARISON:  Chest radiograph January 08, 2023 FINDINGS: The heart size and mediastinal contours are within normal limits. Both lungs are clear. The visualized skeletal structures are unremarkable. IMPRESSION: No active disease. Electronically Signed   By: Megan  Zare M.D.   On: 10/12/2023 13:08     Procedures   Medications Ordered in the ED - No data to display                                   Medical Decision Making Well-appearing adult male with history of hypertension, hyperlipidemia, elevated risk profile presents with largely back pain, but also chest pain that occurred earlier today.  Differential includes musculoskeletal, ACS, less likely dissection given preserved pulses, absence of neurocomplaints. Cardiac 60 sinus normal pulse ox 100% room air normal  Patient took aspirin prior to arrival  Amount and/or Complexity of Data Reviewed Independent Historian: EMS    Details: EMS run strip with sinus rhythm, rate 61, T wave inversions laterally External Data Reviewed: notes. Labs: ordered. Decision-making details documented in ED Course. Radiology: ordered and independent interpretation performed. Decision-making details documented in ED Course. ECG/medicine tests: ordered and independent interpretation performed. Decision-making details documented in ED Course.   4:32 PM Patient awake, alert, and stress, vital signs unremarkable.  Initial troponin normal, initial labs all otherwise unremarkable, patient has no chest pain, EKG is nonischemic, though it is slightly different from prior. Patient states that he has to depart, we discussed the importance of following his labs via MyChart, patient states that he will return with an abnormal troponin value.  Otherwise he will follow-up with primary care.     Final diagnoses:  Acute left-sided thoracic back pain  Atypical chest pain    ED Discharge Orders     None          Garrick Charleston, MD 10/12/23 [phone]

## 2023-10-12 NOTE — ED Triage Notes (Addendum)
 Patient BIB [facility] from home. Has right upper back pain near his shoulder blade.  Began when he woke up today. When he leaned forward it worsened with movement. He thinks he slept wrong or pulled a muscle.

## 2023-10-12 NOTE — ED Notes (Signed)
 Pt made aware of delay, currently no complaints at this time.

## 2023-10-12 NOTE — ED Notes (Signed)
 Called lab to inquire on status of pending labs, they reported that they just got their equipment back up and estimate result in around 10 or so minutes, notified EDP.

## 2023-10-12 NOTE — Discharge Instructions (Signed)
 Please be sure to pay attention to your remaining results via MyChart, and if there are abnormalities or if you develop new, or concerning changes, return here.  Otherwise follow-up with your physician.

## 2023-10-12 NOTE — ED Notes (Addendum)
 Lab contacted to inquire why blood work has not been ran.   Writer recollected blood work due to lab losing 1st set.

## 2023-10-19 ENCOUNTER — Telehealth: Payer: Self-pay | Admitting: Podiatry

## 2023-10-19 ENCOUNTER — Ambulatory Visit (INDEPENDENT_AMBULATORY_CARE_PROVIDER_SITE_OTHER): Admitting: Podiatry

## 2023-10-19 DIAGNOSIS — Z91199 Patient's noncompliance with other medical treatment and regimen due to unspecified reason: Secondary | ICD-10-CM

## 2023-10-19 NOTE — Progress Notes (Signed)
 Cancel 24 hours

## 2023-10-19 NOTE — Telephone Encounter (Signed)
 Called and left a voicemail requesting a call back. Patient needs to be scheduled

## 2023-10-20 ENCOUNTER — Ambulatory Visit: Admitting: Podiatry

## 2023-10-21 ENCOUNTER — Ambulatory Visit (INDEPENDENT_AMBULATORY_CARE_PROVIDER_SITE_OTHER): Admitting: Podiatry

## 2023-10-21 ENCOUNTER — Encounter: Payer: Self-pay | Admitting: Podiatry

## 2023-10-21 DIAGNOSIS — M7752 Other enthesopathy of left foot: Secondary | ICD-10-CM | POA: Diagnosis not present

## 2023-10-21 DIAGNOSIS — M7751 Other enthesopathy of right foot: Secondary | ICD-10-CM

## 2023-10-21 NOTE — Progress Notes (Signed)
  Subjective:  Patient ID: Shaun Noble, male    DOB: January 19, 1965,   MRN: [id number]  Chief Complaint  Patient presents with   Toe Pain    I have pain on the bottom of my big toes when I wear my steel toe shoes.  I want a second opinion.  I went to another Podiatrist and she said I have subungual exostosis.  She just had me to wrap my toes.  It helped a little bit.  I want to know if there's something else I can do.    58 y.o. male presents for concern of pain on the bottom of both great toes that has been present for about a month.  Relates he wears steel toed boots at work.  He has seen another podiatrist and wanting a second opinion on what to do. Relates burning sensation in feet when wearing his steel toed boots. He was given athletes foot cream which helped with this problem but still gettting burning.   . Denies any other pedal complaints. Denies n/v/f/c.   Past Medical History:  Diagnosis Date   GERD (gastroesophageal reflux disease)    Hypercholesteremia    Hypertension    Prediabetes     Objective:  Physical Exam: Vascular: DP/PT pulses 2/4 bilateral. CFT <3 seconds. Normal hair growth on digits. No edema.  Skin. No lacerations or abrasions bilateral feet.  Musculoskeletal: MMT 5/5 bilateral lower extremities in DF, PF, Inversion and Eversion. Deceased ROM in DF of ankle joint. No pain or tenderness to palpation about the foot currently.  Neurological: Sensation intact to light touch.   Assessment:   1. Capsulitis of toe of left foot   2. Capsulitis of toe, right      Plan:  Patient was evaluated and treated and all questions answered. -Notes and imaging reviewed from preivous provider.  -Educated on capsulitis and neuritis and treatment options  -Discussed padding including toe caps and making sure shoes are sized properly.  -Did discuss other possible causes including neuropathy and radiculopathy.   -Patient to follow-up as needed. Discussed calling if any  changes or increased pain.    Asberry Failing, DPM

## 2023-11-30 ENCOUNTER — Encounter: Payer: Self-pay | Admitting: Podiatry

## 2023-11-30 ENCOUNTER — Ambulatory Visit: Admitting: Podiatry

## 2023-11-30 DIAGNOSIS — B353 Tinea pedis: Secondary | ICD-10-CM

## 2023-11-30 MED ORDER — KETOCONAZOLE 2 % EX CREA: 1.0000 | TOPICAL_CREAM | Freq: Every day | CUTANEOUS | 2 refills | Status: AC

## 2023-11-30 NOTE — Progress Notes (Signed)
  Subjective:  Patient ID: Shaun Noble, male    DOB: 02-19-1965,   MRN: [id number]  Chief Complaint  Patient presents with   Toe Pain    The nail problem is getting better.   Tinea Pedis    My feet sweat a lot.   I developed this athlete's feet.  My shoes have been rubbing it.  I went to Urgent Care.  They prescribed me Clindamycin.    58 y.o. male presents for new concern of rash on the bottom of his right foot.  He relates he sweats a lot in his shoes and they started rubbing.  He was seen in urgent care and he was prescribed clindamycin as well as itraconazole.  He has been taking the clindamycin.  Insurance did not approve the itraconazole.. Denies any other pedal complaints. Denies n/v/f/c.   Past Medical History:  Diagnosis Date   GERD (gastroesophageal reflux disease)    Hypercholesteremia    Hypertension    Prediabetes     Objective:  Physical Exam: Vascular: DP/PT pulses 2/4 bilateral. CFT <3 seconds. Normal hair growth on digits. No edema.  Skin. No lacerations or abrasions bilateral feet.  Scaling erythema edema granular tissue blistering noted to plantar ball of right foot as well as 1 through 4 interspaces. Musculoskeletal: MMT 5/5 bilateral lower extremities in DF, PF, Inversion and Eversion. Deceased ROM in DF of ankle joint. No pain or tenderness to palpation about the foot currently.  Neurological: Sensation intact to light touch.   Assessment:   1. Tinea pedis of right foot       Plan:  Patient was evaluated and treated and all questions answered. -Examined patient -Discussed treatment options for painful tinea pedis -Discussed fungal nail treatment options including oral, topical, and laser treatments.  -Advised to start itraconazole as soon as he gets it. -Ketoconazole  sent to the pharmacy in the meantime to apply nightly to the area.  Advised on applying Betadine in the morning to the area. -Patient to return in 4 weeks for follow up evaluation  and discussion of fungal culture results or sooner if symptoms worsen.    Asberry Failing, DPM

## 2023-12-17 NOTE — Progress Notes (Signed)
 Patient ID:  Shaun Noble is a 58 y.o. (DOB Mar 15, 1965) male.   Subjective      Patient presents with   Hypertension    F/u      History of Present Illness Mr. Shaun Noble is a 58yo male patient presents for evaluation of a bacterial fungal infection in his foot, prediabetes, and dry skin.  He has been experiencing a bacterial fungal infection in his foot, which he attributes to his prolonged work hours of 12 hours per day, 5 days a week, and an additional 8 hours on weekends. This necessitated a week-long absence from work and two visits to urgent care due to an allergic reaction to the prescribed antibiotics. The reaction manifested as hives and itching. He also reported symptoms of jock itch and excessive sweating between his legs during sleep, a new development for him. He has been under the care of a podiatrist since 09/2023 for bone spurs in his big toes. He sought a second opinion from another specialist who successfully treated his fungal infection. He is scheduled for a follow-up visit on 01/08/2024. He has requested FMLA paperwork due to his inability to work and a handicap placard to facilitate walking shorter distances.   He has made significant lifestyle changes, including weight loss, improved diet, and reduced alcohol consumption. He abstained from alcohol for two weeks but consumed a small amount of beer and wine during a vacation. He has been engaging in physical activities such as treadmill exercises and walking in the park. He has increased his water intake at home and work, consuming 2 to 3 bottles daily. He has been researching high blood pressure and identified dehydration, poor diet, and lack of rest as potential causes. He has been working night shifts from midnight to noon, 5 days a week, and occasionally on weekends. He has been avoiding meat for the past 2 to 3 days and has been consuming cabbage.  He has been experiencing dry skin.  Diet: Improved diet, avoiding meat for  the past 2 to 3 days, consuming cabbage Alcohol: Reduced consumption, abstained for two weeks, consumed small amounts of beer and wine during vacation      Reviewed and updated this visit by provider: Tobacco  Allergies  Meds  Problems  Med Hx  Surg Hx  Fam Hx        Review of Systems  Negative except as noted in History of Present Illness.  Objective   BP 150/86   Pulse 60   Temp 97.8 F (36.6 C) (Temporal)   Resp 17   Ht 6' 1 (1.854 m)   Wt 257 lb 9.6 oz (116.8 kg)   SpO2 98%   BMI 33.99 kg/m  Physical Exam    Physical Exam Constitutional:      Appearance: Normal appearance.  Eyes:     Extraocular Movements: Extraocular movements intact.     Pupils: Pupils are equal, round, and reactive to light.  Cardiovascular:     Rate and Rhythm: Normal rate and regular rhythm.     Pulses: Normal pulses.     Heart sounds: Normal heart sounds.  Pulmonary:     Effort: Pulmonary effort is normal.     Breath sounds: Normal breath sounds.  Neurological:     Mental Status: He is alert and oriented to person, place, and time.  Psychiatric:        Mood and Affect: Mood normal.        Behavior: Behavior normal.  Assessment/Plan   Assessment & Plan    Assessment & Plan Primary hypertension - Blood pressure controlled with current medication and lifestyle changes. - No adjustments are necessary. Prediabetes  Orders:   POCT A1C - His A1c level is currently at 5.9, indicating prediabetes. - He was counseled on the importance of maintaining an A1c level below 5.7 to prevent progression to diabetes. - Dietary recommendations included the consumption of green vegetables, lean meats, and beans. He was also encouraged to engage in regular physical activity.    Risks, benefits, and alternatives of the medications and treatment plan prescribed today were discussed, and patient expressed understanding. Follow up in about 6 months (around 06/16/2024).   Christopher Bohr,  NP 12/17/2023, 12:41 PM    Orders Placed This Encounter  Procedures   POCT A1C   Patient's Medications  New Prescriptions   No medications on file  Previous Medications   AMLODIPINE  BESYLATE (NORVASC ) 10 MG TABLET    Take one tablet (10 mg dose) by mouth daily.   AMLODIPINE -ATORVASTATIN  (CADUET) 10-20 MG PER TABLET    Take one tablet by mouth daily.   CETIRIZINE (ZYRTEC) 10 MG TABLET    Take one tablet (10 mg dose) by mouth 2 (two) times daily.   CHLORTHALIDONE 25 MG TABLET    Take one tablet (25 mg dose) by mouth daily.   CYCLOBENZAPRINE (FLEXERIL) 10 MG TABLET    Take one tablet (10 mg dose) by mouth at bedtime as needed for Muscle spasms.   FAMOTIDINE (PEPCID) 40 MG TABLET    Take one tablet (40 mg dose) by mouth 2 (two) times daily.   HYDROXYZINE  HCL (ATARAX ) 25 MG TABLET    Take one tablet (25 mg dose) by mouth every 6 (six) hours as needed.   KETOCONAZOLE  (NIZORAL ) 2 % CREAM    Apply one Application topically daily.   NA SULFATE-POTASSIUM SULFATE-MAGNESIUM SULFATE (SUPREP BOWEL PREP) 17.5-3.13-1.6 GM/177ML    Take 177 mLs by mouth 2 (two) times.   OMEPRAZOLE  (PRILOSEC) 20 MG CAPSULE    Take one capsule (20 mg dose) by mouth as needed.   TELMISARTAN-AMLODIPINE  (TWYNSTA) 40-10 MG TABLET    Take one tablet by mouth daily.  Modified Medications   No medications on file  Discontinued Medications   No medications on file     Computer technology was used to create visit note. Consent from the patient/caregiver was obtained prior to its use.

## 2023-12-28 ENCOUNTER — Ambulatory Visit: Admitting: Podiatry

## 2023-12-28 ENCOUNTER — Encounter: Payer: Self-pay | Admitting: Podiatry

## 2023-12-28 DIAGNOSIS — B353 Tinea pedis: Secondary | ICD-10-CM | POA: Diagnosis not present

## 2023-12-28 MED ORDER — CLOTRIMAZOLE-BETAMETHASONE 1-0.05 % EX CREA: 1.0000 | TOPICAL_CREAM | Freq: Every day | CUTANEOUS | 0 refills | Status: DC

## 2023-12-28 NOTE — Progress Notes (Signed)
"  °  Subjective:  Patient ID: Shaun Noble, male    DOB: [birth year],   MRN: [id number]  Chief Complaint  Patient presents with   Tinea Pedis    It's doing a little bit better.  They had me on the antibiotics but I was having an allergic reaction to it.  I started breaking out in hives.  I think it was the Cephalexin.  I stopped taking it and the Itraconozole.  I didn't know which one was causing it.    58 y.o. male presents for follow-up of tinea pedis.  Relates he is doing little bit better.  The left foot has totally cleared up.  The right foot is still causing some trouble.  He relates he had to stop taking the antibiotics and the itraconazole.  He is currently taking the ketoconazole  twice a day.. Denies any other pedal complaints. Denies n/v/f/c.   Past Medical History:  Diagnosis Date   GERD (gastroesophageal reflux disease)    Hypercholesteremia    Hypertension    Prediabetes     Objective:  Physical Exam: Vascular: DP/PT pulses 2/4 bilateral. CFT <3 seconds. Normal hair growth on digits. No edema.  Skin. No lacerations or abrasions bilateral feet.  Scaling erythema edema granular tissue blistering noted to plantar ball of right foot as well as 1 through 4 interspaces. Musculoskeletal: MMT 5/5 bilateral lower extremities in DF, PF, Inversion and Eversion. Deceased ROM in DF of ankle joint. No pain or tenderness to palpation about the foot currently.  Neurological: Sensation intact to light touch.   Assessment:   1. Tinea pedis of right foot        Plan:  Patient was evaluated and treated and all questions answered. -Examined patient -Discussed treatment options for painful tinea pedis -Discussed fungal nail treatment options including oral, topical, and laser treatments.  -Continue ketoconazole  and Betadine alternating.  Will add on Lotrisone .  Sent to pharmacy. -Patient to return in 4 weeks for follow up evaluation and discussion of fungal culture results or sooner if  symptoms worsen.    Asberry Failing, DPM    "

## 2024-01-27 ENCOUNTER — Ambulatory Visit: Admitting: Podiatry

## 2024-01-27 ENCOUNTER — Encounter: Payer: Self-pay | Admitting: Podiatry

## 2024-01-27 DIAGNOSIS — B353 Tinea pedis: Secondary | ICD-10-CM

## 2024-01-27 MED ORDER — CLOTRIMAZOLE-BETAMETHASONE 1-0.05 % EX CREA: 1.0000 | TOPICAL_CREAM | Freq: Every day | CUTANEOUS | 3 refills | Status: AC

## 2024-01-27 NOTE — Progress Notes (Signed)
"  °  Subjective:  Patient ID: Shaun Noble, male    DOB: 1965/01/09,   MRN: [id number]  Chief Complaint  Patient presents with   Tinea Pedis    I think they're a lot better.  Can I get a refill of the medication that she put me on? (Lotrisone )    59 y.o. male presents for follow-up of tinea pedis.  Relates doing much better now.  He relates the right foot has cleared up as well.  He has been using the Lotrisone  which seems to be helping.  Denies any other pedal complaints. Denies n/v/f/c.   Past Medical History:  Diagnosis Date   GERD (gastroesophageal reflux disease)    Hypercholesteremia    Hypertension    Prediabetes     Objective:  Physical Exam: Vascular: DP/PT pulses 2/4 bilateral. CFT <3 seconds. Normal hair growth on digits. No edema.  Skin. No lacerations or abrasions bilateral feet.  Scaling erythema edema granular tissue blistering noted to plantar ball of right foot as well as 1 through 4 interspace has cleared and toes are looking normal in appearance. Musculoskeletal: MMT 5/5 bilateral lower extremities in DF, PF, Inversion and Eversion. Deceased ROM in DF of ankle joint. No pain or tenderness to palpation about the foot currently.  Neurological: Sensation intact to light touch.   Assessment:   1. Tinea pedis of right foot         Plan:  Patient was evaluated and treated and all questions answered. -Examined patient -Discussed treatment options for painful tinea pedis -Discussed fungal nail treatment options including oral, topical, and laser treatments.  -Will continue lotrisone  for a few more weeks then use as needed.  -Patient to return as needed  Asberry Failing, DPM    "
